# Patient Record
Sex: Male | Born: 1935 | Race: White | Hispanic: No | Marital: Single | Smoking: Former smoker
Health system: Southern US, Community
[De-identification: ages and names within clinical notes are randomized; demographics above are authoritative.]

## PROBLEM LIST (undated history)

## (undated) DIAGNOSIS — J449 Chronic obstructive pulmonary disease, unspecified: Secondary | ICD-10-CM

## (undated) DIAGNOSIS — N189 Chronic kidney disease, unspecified: Secondary | ICD-10-CM

## (undated) DIAGNOSIS — K222 Esophageal obstruction: Secondary | ICD-10-CM

---

## 2020-12-22 ENCOUNTER — Inpatient Hospital Stay (HOSPITAL_COMMUNITY)
Admission: EM | Admit: 2020-12-22 | Discharge: 2020-12-30 | DRG: 393 | Disposition: A | Discharge status: Hospice | Payer: Medicare Other | Source: Skilled Nursing Facility | Attending: Internal Medicine | Admitting: Internal Medicine

## 2020-12-22 ENCOUNTER — Encounter (HOSPITAL_COMMUNITY): Payer: Self-pay

## 2020-12-22 ENCOUNTER — Other Ambulatory Visit: Payer: Self-pay

## 2020-12-22 ENCOUNTER — Emergency Department (HOSPITAL_COMMUNITY): Payer: Medicare Other | Admitting: Registered Nurse

## 2020-12-22 ENCOUNTER — Encounter (HOSPITAL_COMMUNITY)
Admission: EM | Disposition: A | Discharge status: Hospice | Payer: Self-pay | Source: Skilled Nursing Facility | Attending: Internal Medicine

## 2020-12-22 ENCOUNTER — Observation Stay (HOSPITAL_COMMUNITY): Payer: Medicare Other

## 2020-12-22 ENCOUNTER — Emergency Department (HOSPITAL_COMMUNITY): Payer: Medicare Other

## 2020-12-22 DIAGNOSIS — G9341 Metabolic encephalopathy: Secondary | ICD-10-CM | POA: Diagnosis present

## 2020-12-22 DIAGNOSIS — N183 Chronic kidney disease, stage 3 unspecified: Secondary | ICD-10-CM

## 2020-12-22 DIAGNOSIS — Z8546 Personal history of malignant neoplasm of prostate: Secondary | ICD-10-CM

## 2020-12-22 DIAGNOSIS — J449 Chronic obstructive pulmonary disease, unspecified: Secondary | ICD-10-CM

## 2020-12-22 DIAGNOSIS — I2510 Atherosclerotic heart disease of native coronary artery without angina pectoris: Secondary | ICD-10-CM

## 2020-12-22 DIAGNOSIS — X58XXXA Exposure to other specified factors, initial encounter: Secondary | ICD-10-CM | POA: Diagnosis present

## 2020-12-22 DIAGNOSIS — I130 Hypertensive heart and chronic kidney disease with heart failure and stage 1 through stage 4 chronic kidney disease, or unspecified chronic kidney disease: Secondary | ICD-10-CM | POA: Diagnosis present

## 2020-12-22 DIAGNOSIS — D696 Thrombocytopenia, unspecified: Secondary | ICD-10-CM | POA: Diagnosis present

## 2020-12-22 DIAGNOSIS — F01518 Vascular dementia, unspecified severity, with other behavioral disturbance: Secondary | ICD-10-CM

## 2020-12-22 DIAGNOSIS — N179 Acute kidney failure, unspecified: Secondary | ICD-10-CM | POA: Diagnosis present

## 2020-12-22 DIAGNOSIS — T18128A Food in esophagus causing other injury, initial encounter: Principal | ICD-10-CM | POA: Diagnosis present

## 2020-12-22 DIAGNOSIS — Z888 Allergy status to other drugs, medicaments and biological substances status: Secondary | ICD-10-CM

## 2020-12-22 DIAGNOSIS — Z781 Physical restraint status: Secondary | ICD-10-CM

## 2020-12-22 DIAGNOSIS — Z66 Do not resuscitate: Secondary | ICD-10-CM | POA: Diagnosis present

## 2020-12-22 DIAGNOSIS — E782 Mixed hyperlipidemia: Secondary | ICD-10-CM

## 2020-12-22 DIAGNOSIS — F0151 Vascular dementia with behavioral disturbance: Secondary | ICD-10-CM | POA: Diagnosis present

## 2020-12-22 DIAGNOSIS — T17908A Unspecified foreign body in respiratory tract, part unspecified causing other injury, initial encounter: Secondary | ICD-10-CM

## 2020-12-22 DIAGNOSIS — N1832 Chronic kidney disease, stage 3b: Secondary | ICD-10-CM | POA: Diagnosis present

## 2020-12-22 DIAGNOSIS — E860 Dehydration: Secondary | ICD-10-CM | POA: Diagnosis present

## 2020-12-22 DIAGNOSIS — I509 Heart failure, unspecified: Secondary | ICD-10-CM

## 2020-12-22 DIAGNOSIS — Z515 Encounter for palliative care: Secondary | ICD-10-CM

## 2020-12-22 DIAGNOSIS — K222 Esophageal obstruction: Secondary | ICD-10-CM | POA: Diagnosis present

## 2020-12-22 DIAGNOSIS — Z885 Allergy status to narcotic agent status: Secondary | ICD-10-CM

## 2020-12-22 DIAGNOSIS — Z79899 Other long term (current) drug therapy: Secondary | ICD-10-CM

## 2020-12-22 DIAGNOSIS — J441 Chronic obstructive pulmonary disease with (acute) exacerbation: Secondary | ICD-10-CM | POA: Diagnosis present

## 2020-12-22 DIAGNOSIS — Z87891 Personal history of nicotine dependence: Secondary | ICD-10-CM

## 2020-12-22 DIAGNOSIS — Y92129 Unspecified place in nursing home as the place of occurrence of the external cause: Secondary | ICD-10-CM

## 2020-12-22 DIAGNOSIS — Z20822 Contact with and (suspected) exposure to covid-19: Secondary | ICD-10-CM | POA: Diagnosis present

## 2020-12-22 DIAGNOSIS — R0989 Other specified symptoms and signs involving the circulatory and respiratory systems: Secondary | ICD-10-CM

## 2020-12-22 DIAGNOSIS — J9601 Acute respiratory failure with hypoxia: Secondary | ICD-10-CM | POA: Diagnosis present

## 2020-12-22 DIAGNOSIS — H3530 Unspecified macular degeneration: Secondary | ICD-10-CM

## 2020-12-22 DIAGNOSIS — K56699 Other intestinal obstruction unspecified as to partial versus complete obstruction: Secondary | ICD-10-CM | POA: Diagnosis present

## 2020-12-22 DIAGNOSIS — J690 Pneumonitis due to inhalation of food and vomit: Secondary | ICD-10-CM | POA: Diagnosis present

## 2020-12-22 DIAGNOSIS — Z7189 Other specified counseling: Secondary | ICD-10-CM

## 2020-12-22 DIAGNOSIS — R0602 Shortness of breath: Secondary | ICD-10-CM

## 2020-12-22 DIAGNOSIS — K219 Gastro-esophageal reflux disease without esophagitis: Secondary | ICD-10-CM

## 2020-12-22 DIAGNOSIS — Z7982 Long term (current) use of aspirin: Secondary | ICD-10-CM

## 2020-12-22 HISTORY — DX: Gastro-esophageal reflux disease without esophagitis: K21.9

## 2020-12-22 HISTORY — DX: Unspecified macular degeneration: H35.30

## 2020-12-22 HISTORY — DX: Esophageal obstruction: K22.2

## 2020-12-22 HISTORY — DX: Chronic kidney disease, unspecified: N18.9

## 2020-12-22 HISTORY — DX: Mixed hyperlipidemia: E78.2

## 2020-12-22 HISTORY — PX: BALLOON DILATION: SHX5330

## 2020-12-22 HISTORY — DX: Atherosclerotic heart disease of native coronary artery without angina pectoris: I25.10

## 2020-12-22 HISTORY — PX: FOREIGN BODY REMOVAL: SHX962

## 2020-12-22 HISTORY — DX: Vascular dementia, unspecified severity, with other behavioral disturbance: F01.518

## 2020-12-22 HISTORY — DX: Vascular dementia with behavioral disturbance: F01.51

## 2020-12-22 HISTORY — DX: Chronic obstructive pulmonary disease, unspecified: J44.9

## 2020-12-22 HISTORY — PX: ESOPHAGOGASTRODUODENOSCOPY (EGD) WITH PROPOFOL: SHX5813

## 2020-12-22 HISTORY — DX: Heart failure, unspecified: I50.9

## 2020-12-22 HISTORY — DX: Personal history of malignant neoplasm of prostate: Z85.46

## 2020-12-22 LAB — POC SARS CORONAVIRUS 2 AG -  ED: SARSCOV2ONAVIRUS 2 AG: NEGATIVE

## 2020-12-22 SURGERY — ESOPHAGOGASTRODUODENOSCOPY (EGD) WITH PROPOFOL
Anesthesia: General

## 2020-12-22 MED ORDER — PHENYLEPHRINE 40 MCG/ML (10ML) SYRINGE FOR IV PUSH (FOR BLOOD PRESSURE SUPPORT)
PREFILLED_SYRINGE | INTRAVENOUS | Status: DC | PRN
  Administered 2020-12-22: 80 ug via INTRAVENOUS

## 2020-12-22 MED ORDER — ARFORMOTEROL TARTRATE 15 MCG/2ML IN NEBU
15.0000 ug | INHALATION_SOLUTION | Freq: Two times a day (BID) | RESPIRATORY_TRACT | Status: DC
  Administered 2020-12-23 – 2020-12-30 (×13): 15 ug via RESPIRATORY_TRACT
  Filled 2020-12-22 (×14): qty 2

## 2020-12-22 MED ORDER — IPRATROPIUM-ALBUTEROL 0.5-2.5 (3) MG/3ML IN SOLN
3.0000 mL | RESPIRATORY_TRACT | Status: DC | PRN
  Administered 2020-12-28: 3 mL via RESPIRATORY_TRACT
  Filled 2020-12-22: qty 3

## 2020-12-22 MED ORDER — SODIUM CHLORIDE 0.9 % IV SOLN
500.0000 mg | Freq: Every day | INTRAVENOUS | Status: DC
  Administered 2020-12-23 – 2020-12-27 (×6): 500 mg via INTRAVENOUS
  Filled 2020-12-22 (×7): qty 500

## 2020-12-22 MED ORDER — EPHEDRINE SULFATE-NACL 50-0.9 MG/10ML-% IV SOSY
PREFILLED_SYRINGE | INTRAVENOUS | Status: DC | PRN
  Administered 2020-12-22 (×3): 5 mg via INTRAVENOUS

## 2020-12-22 MED ORDER — PANTOPRAZOLE SODIUM 40 MG PO TBEC
40.0000 mg | DELAYED_RELEASE_TABLET | Freq: Every day | ORAL | Status: DC
  Administered 2020-12-27 – 2020-12-30 (×2): 40 mg via ORAL
  Filled 2020-12-22 (×4): qty 1

## 2020-12-22 MED ORDER — GLYCOPYRROLATE 0.2 MG/ML IJ SOLN
INTRAMUSCULAR | Status: DC | PRN
  Administered 2020-12-22 (×2): .1 mg via INTRAVENOUS

## 2020-12-22 MED ORDER — RISPERIDONE 0.25 MG PO TABS
0.2500 mg | ORAL_TABLET | Freq: Every day | ORAL | Status: DC
  Administered 2020-12-27: 0.25 mg via ORAL
  Filled 2020-12-22 (×7): qty 1

## 2020-12-22 MED ORDER — CEFTRIAXONE SODIUM 1 G IJ SOLR
2.0000 g | Freq: Every day | INTRAMUSCULAR | Status: DC
  Filled 2020-12-22: qty 20

## 2020-12-22 MED ORDER — POLYETHYLENE GLYCOL 3350 17 G PO PACK
17.0000 g | PACK | Freq: Every day | ORAL | Status: DC | PRN
  Filled 2020-12-22: qty 1

## 2020-12-22 MED ORDER — FUROSEMIDE 40 MG PO TABS: 40.0000 mg | ORAL_TABLET | Freq: Every day | ORAL | Status: DC

## 2020-12-22 MED ORDER — ONDANSETRON HCL 4 MG/2ML IJ SOLN
INTRAMUSCULAR | Status: DC | PRN
  Administered 2020-12-22: 4 mg via INTRAVENOUS

## 2020-12-22 MED ORDER — ACETAMINOPHEN 325 MG PO TABS
650.0000 mg | ORAL_TABLET | ORAL | Status: DC | PRN
  Filled 2020-12-22: qty 2

## 2020-12-22 MED ORDER — SODIUM CHLORIDE 0.9 % IV SOLN: INTRAVENOUS | Status: DC

## 2020-12-22 MED ORDER — LACTATED RINGERS IV SOLN: INTRAVENOUS | Status: DC | PRN

## 2020-12-22 MED ORDER — PROPOFOL 10 MG/ML IV BOLUS
INTRAVENOUS | Status: DC | PRN
  Administered 2020-12-22: 25 mg via INTRAVENOUS
  Administered 2020-12-22: 100 mg via INTRAVENOUS

## 2020-12-22 MED ORDER — DEXAMETHASONE SODIUM PHOSPHATE 10 MG/ML IJ SOLN
INTRAMUSCULAR | Status: DC | PRN
  Administered 2020-12-22: 5 mg via INTRAVENOUS

## 2020-12-22 MED ORDER — HALOPERIDOL LACTATE 5 MG/ML IJ SOLN: 2.5000 mg | Freq: Once | INTRAMUSCULAR | Status: DC

## 2020-12-22 MED ORDER — MELATONIN 5 MG PO TABS
10.0000 mg | ORAL_TABLET | Freq: Every evening | ORAL | Status: DC | PRN
  Filled 2020-12-22 (×2): qty 2

## 2020-12-22 MED ORDER — ROCURONIUM BROMIDE 10 MG/ML (PF) SYRINGE
PREFILLED_SYRINGE | INTRAVENOUS | Status: DC | PRN
  Administered 2020-12-22: 10 mg via INTRAVENOUS
  Administered 2020-12-22: 20 mg via INTRAVENOUS

## 2020-12-22 MED ORDER — LIDOCAINE 2% (20 MG/ML) 5 ML SYRINGE
INTRAMUSCULAR | Status: DC | PRN
  Administered 2020-12-22: 60 mg via INTRAVENOUS

## 2020-12-22 MED ORDER — ATORVASTATIN CALCIUM 40 MG PO TABS
40.0000 mg | ORAL_TABLET | Freq: Every day | ORAL | Status: DC
  Administered 2020-12-27: 40 mg via ORAL
  Filled 2020-12-22: qty 1

## 2020-12-22 MED ORDER — PANTOPRAZOLE SODIUM 40 MG IV SOLR
40.0000 mg | Freq: Once | INTRAVENOUS | Status: AC
  Administered 2020-12-23: 40 mg via INTRAVENOUS
  Filled 2020-12-22: qty 40

## 2020-12-22 MED ORDER — SUCCINYLCHOLINE CHLORIDE 200 MG/10ML IV SOSY
PREFILLED_SYRINGE | INTRAVENOUS | Status: DC | PRN
  Administered 2020-12-22: 100 mg via INTRAVENOUS

## 2020-12-22 MED ORDER — ONDANSETRON HCL 4 MG/2ML IJ SOLN: 4.0000 mg | Freq: Four times a day (QID) | INTRAMUSCULAR | Status: DC | PRN

## 2020-12-22 MED ORDER — SUGAMMADEX SODIUM 200 MG/2ML IV SOLN
INTRAVENOUS | Status: DC | PRN
  Administered 2020-12-22: 200 mg via INTRAVENOUS

## 2020-12-22 MED ORDER — ENOXAPARIN SODIUM 40 MG/0.4ML IJ SOSY: 40.0000 mg | PREFILLED_SYRINGE | INTRAMUSCULAR | Status: DC

## 2020-12-22 MED ORDER — ASPIRIN EC 81 MG PO TBEC: 81.0000 mg | DELAYED_RELEASE_TABLET | Freq: Every day | ORAL | Status: DC

## 2020-12-22 MED ORDER — PHENYLEPHRINE HCL-NACL 10-0.9 MG/250ML-% IV SOLN
INTRAVENOUS | Status: DC | PRN
  Administered 2020-12-22: 25 ug/min via INTRAVENOUS

## 2020-12-22 MED ORDER — LORAZEPAM 0.5 MG PO TABS: 0.5000 mg | ORAL_TABLET | Freq: Three times a day (TID) | ORAL | Status: DC | PRN

## 2020-12-22 MED ORDER — UMECLIDINIUM BROMIDE 62.5 MCG/INH IN AEPB
1.0000 | INHALATION_SPRAY | Freq: Every day | RESPIRATORY_TRACT | Status: DC
  Filled 2020-12-22: qty 7

## 2020-12-22 MED ORDER — TIOTROPIUM BROMIDE MONOHYDRATE 18 MCG IN CAPS: 18.0000 ug | ORAL_CAPSULE | Freq: Every day | RESPIRATORY_TRACT | Status: DC

## 2020-12-22 MED ORDER — IRBESARTAN 150 MG PO TABS: 150.0000 mg | ORAL_TABLET | Freq: Every day | ORAL | Status: DC

## 2020-12-22 MED ORDER — LORAZEPAM 2 MG/ML IJ SOLN
1.0000 mg | Freq: Once | INTRAMUSCULAR | Status: AC
  Administered 2020-12-22: 1 mg via INTRAVENOUS
  Filled 2020-12-22: qty 1

## 2020-12-22 MED ORDER — GLUCAGON HCL RDNA (DIAGNOSTIC) 1 MG IJ SOLR
1.0000 mg | Freq: Once | INTRAMUSCULAR | Status: AC
  Administered 2020-12-22: 1 mg via INTRAVENOUS
  Filled 2020-12-22: qty 1

## 2020-12-22 SURGICAL SUPPLY — 14 items

## 2020-12-22 NOTE — ED Provider Notes (Signed)
[facility] EMERGENCY DEPARTMENT Provider Note   CSN: [id number] Arrival date & time: 12/22/20  1659     History Chief Complaint  Patient presents with  . FOOD stuck in throat    Gabriel Austin is a 87 y.o. male.  HPI Patient is a 87 year old male with an extensive medical history presenting with a chief complaint of food impaction.  Patient does have a history of similar in the past.  Patient is not tolerating his secretions in triage.  Patient denies fevers or chills, nausea vomiting or severe shortness of breath.  Patient otherwise has been doing well recently moved into a skilled nursing facility.  Event occurred probably 6 hours prior to arrival.  No past medical history on file.  There are no problems to display for this patient.    No family history on file.     Home Medications Prior to Admission medications   Not on File    Allergies    Beta adrenergic blockers, Codeine, Reopro [abciximab], and Pulmicort [budesonide]  Review of Systems   Review of Systems  Constitutional: Negative for chills and fever.  HENT: Positive for trouble swallowing. Negative for ear pain and sore throat.   Eyes: Negative for pain and visual disturbance.  Respiratory: Negative for cough and shortness of breath.   Cardiovascular: Negative for chest pain and palpitations.  Gastrointestinal: Positive for vomiting. Negative for abdominal pain.  Genitourinary: Negative for dysuria and hematuria.  Musculoskeletal: Negative for arthralgias and back pain.  Skin: Negative for color change and rash.  Neurological: Negative for seizures and syncope.  All other systems reviewed and are negative.   Physical Exam Updated Vital Signs BP (!) 151/65 (BP Location: Left Arm)   Pulse (!) 55   Temp 98.2 F (36.8 C) (Oral)   Resp 12   SpO2 99%   Physical Exam Vitals and nursing note reviewed.  Constitutional:      Appearance: He is well-developed.  HENT:     Head:  Normocephalic and atraumatic.     Nose: No congestion or rhinorrhea.     Mouth/Throat:     Mouth: Mucous membranes are moist.     Pharynx: Oropharynx is clear. No oropharyngeal exudate.  Eyes:     Conjunctiva/sclera: Conjunctivae normal.     Pupils: Pupils are equal, round, and reactive to light.  Cardiovascular:     Rate and Rhythm: Normal rate and regular rhythm.     Heart sounds: No murmur heard.   Pulmonary:     Effort: Pulmonary effort is normal. No respiratory distress.     Breath sounds: Normal breath sounds.  Abdominal:     Palpations: Abdomen is soft.     Tenderness: There is no abdominal tenderness.  Musculoskeletal:        General: No swelling, tenderness, deformity or signs of injury. Normal range of motion.     Cervical back: Neck supple. No rigidity or tenderness.  Skin:    General: Skin is warm and dry.  Neurological:     General: No focal deficit present.     Mental Status: He is alert and oriented to person, place, and time. Mental status is at baseline.     Cranial Nerves: No cranial nerve deficit.     Motor: No weakness.     ED Results / Procedures / Treatments   Labs (all labs ordered are listed, but only abnormal results are displayed) Labs Reviewed - No data to display  EKG None  Radiology  No results found.  Procedures Procedures   Medications Ordered in ED Medications - No data to display  ED Course  I have reviewed the triage vital signs and the nursing notes.  Pertinent labs & imaging results that were available during my care of the patient were reviewed by me and considered in my medical decision making (see chart for details).    MDM Rules/Calculators/A&P                          Patient is a 87 year old male with an extensive medical history present with a chief complaint of drooling and inability to tolerate p.o. intake.  Patient had vomiting that initiated during his lunch today and has not been tolerating p.o. intake in the  interim.  Patient is a history of recurrent strictures and food impactions. Patient denies fevers or chills, nausea or vomit, syncope or shortness of breath.  Communicated with GI regarding patient's symptoms and agreed with likely food impaction.  Patient taken to GI endoscopy suite at this time.  Patient stable for transfer to endoscopy.  Final Clinical Impression(s) / ED Diagnoses Final diagnoses:  None    Rx / DC Orders ED Discharge Orders    None       Glyn Ade, MD 12/22/20 2225    Eber Hong, MD 12/30/20 1723

## 2020-12-22 NOTE — Op Note (Signed)
[facility] Patient Name: Gabriel Austin Procedure Date : 12/22/2020 MRN: [id number] Attending MD: Starr Lake. Myrtie Neither , MD Date of Birth: Jan 02, 1921 CSN: [id number] Age: 87 Admit Type: Emergency Department Procedure:                Upper GI endoscopy Indications:              Removal of foreign body in the esophagus (food                            impaction) - reported Hx of food impaction and                            endoscopic dilation at outside hospital Providers:                Sherilyn Cooter L. Myrtie Neither, MD, Rogue Jury, RN, Brooke                            Person, Rosilyn Mings, Technician Referring MD:             [facility] ED physician Medicines:                General Anesthesia Complications:            No immediate complications. Estimated Blood Loss:     Estimated blood loss was minimal. Procedure:                Pre-Anesthesia Assessment:                           - Prior to the procedure, a History and Physical                            was performed, and patient medications and                            allergies were reviewed. The patient's tolerance of                            previous anesthesia was also reviewed. The risks                            and benefits of the procedure and the sedation                            options and risks were discussed with the patient.                            All questions were answered, and informed consent                            was obtained. Prior Anticoagulants: The patient has                            taken no previous anticoagulant or antiplatelet  agents except for aspirin. ASA Grade Assessment:                            III - A patient with severe systemic disease. After                            reviewing the risks and benefits, the patient was                            deemed in satisfactory condition to undergo the                            procedure.                            After obtaining informed consent, the endoscope was                            passed under direct vision. Throughout the                            procedure, the patient's blood pressure, pulse, and                            oxygen saturations were monitored continuously. The                            GIF-H190 ([id number]) Olympus gastroscope was                            introduced through the mouth, and advanced to the                            second part of duodenum. The upper GI endoscopy was                            performed with difficulty due to presence of food.                            The patient tolerated the procedure well. Scope In: Scope Out: Findings:      Food was found in the entire esophagus. Removal of food (meat and       vegetable matter) was accomplished. Countless passes were made over the       course of the approximately 95 minute procedure. Most food was removed       with either a 4-pring grasper or a net device. The remainder was pushed       into the stomach.      One probable benign-appearing, intrinsic stenosis was found at the       gastroesophageal junction. It was difficult to be certain of the       stricture size with the mucosal edema from the prolonged impaction. The       stenosis was no more than 1-2cm in length and was traversed. A TTS  dilator was passed through the scope. Dilation with a 12-13.5-15 mm       balloon dilator was performed to 15 mm. A second 19mm dilation was then       performed. The dilation site was examined and showed no change and no       mucosal wrent. There was no resistance passing the scope through the       EGJ, nor was there dilatation to suggest achalasia.      The middle third of the esophagus and lower third of the esophagus were       tortuous.      Food (residue) was found in the gastric fundus and in the gastric body.      The exam of the stomach was otherwise normal.      The examined  duodenum was normal.      Food debris (vegetable matter) was found in the hypopharynx just above       vocal cords upon scope insertion. During the scope, anesthesia suctioned       vegetable matter from the ETT. Impression:               - Food in the esophagus. Removal was successful.                           - Benign-appearing esophageal stenosis. Dilated.                           - Tortuous esophagus.                           - Food (residue) in the stomach.                           - Normal examined duodenum.                           This patient's dysphagia is most likely due to                            tortuous anatomy and age-related dysmotility. Recommendation:           - Admit the patient to hospital ward for                            observation due to age, GETA and aspiration.                           - Clear liquid diet today. Ground diet starting in                            AM, and patient should remain on that indefinitely                            to decrease the risk of future food impaction.                           - The findings and recommendations were discussed  with the patient's family and the admitting                            hospitalist.                           - No routine office follow up with GI needed. Procedure Code(s):        --- Professional ---                           [id number], Esophagogastroduodenoscopy, flexible,                            transoral; with removal of foreign body(s)                           43249, Esophagogastroduodenoscopy, flexible,                            transoral; with transendoscopic balloon dilation of                            esophagus (less than 30 mm diameter) Diagnosis Code(s):        --- Professional ---                           [id number], Food in esophagus causing other injury,                            initial encounter                           K22.2, Esophageal  obstruction                           T18.2XXA, Foreign body in stomach, initial encounter                           T18.108A, Unspecified foreign body in esophagus                            causing other injury, initial encounter CPT copyright 2019 [employer]. All rights reserved. The codes documented in this report are preliminary and upon coder review may  be revised to meet current compliance requirements. Maninder Deboer L. Myrtie Neitheranis, MD 12/22/2020 10:02:02 PM This report has been signed electronically. Number of Addenda: 0

## 2020-12-22 NOTE — Transfer of Care (Signed)
Immediate Anesthesia Transfer of Care Note  Patient: Gabriel Austin  Procedure(s) Performed: ESOPHAGOGASTRODUODENOSCOPY (EGD) WITH PROPOFOL (N/A ) FOREIGN BODY REMOVAL BALLOON DILATION (N/A )  Patient Location: PACU  Anesthesia Type:General  Level of Consciousness: responds to stimulation; within baseline mentation  Airway & Oxygen Therapy: Patient Spontanous Breathing and Patient connected to face mask oxygen  Post-op Assessment: Report given to RN and Post -op Vital signs reviewed and stable  Post vital signs: Reviewed and stable  Last Vitals:  Vitals Value Taken Time  BP 132/114 12/22/20 2157  Temp    Pulse 71 12/22/20 2158  Resp 19 12/22/20 2158  SpO2 100 % 12/22/20 2158  Vitals shown include unvalidated device data.  Last Pain:  Vitals:   12/22/20 2004  TempSrc: Oral  PainSc: 0-No pain         Complications: No complications documented.

## 2020-12-22 NOTE — ED Triage Notes (Signed)
Pt daughter reports pt was eating chicken and broccoli today and food stuck in throat. Daughter states this happen before and tried using ice cream to get the food down and states pt cannot swallow saliva just hacking up saliva, Pt able to speak in triage and maintain airway O2 stats 99% RA

## 2020-12-22 NOTE — Interval H&P Note (Signed)
History and Physical Interval Note:  12/22/2020 8:12 PM  Gabriel Austin  has presented today for surgery, with the diagnosis of food impaction.  The various methods of treatment have been discussed with the patient and family. After consideration of risks, benefits and other options for treatment, the patient has consented to  Procedure(s): ESOPHAGOGASTRODUODENOSCOPY (EGD) WITH PROPOFOL (N/A) as a surgical intervention.  The patient's history has been reviewed, patient examined, no change in status, stable for surgery.  I have reviewed the patient's chart and labs.  Questions were answered to the patient's satisfaction.    Rapid COVID test negative  Charlie Pitter III

## 2020-12-22 NOTE — Anesthesia Procedure Notes (Signed)
Procedure Name: Intubation Date/Time: 12/22/2020 8:16 PM Performed by: Zollie Scale, CRNA Pre-anesthesia Checklist: Patient identified, Emergency Drugs available, Suction available and Patient being monitored Patient Re-evaluated:Patient Re-evaluated prior to induction Oxygen Delivery Method: Circle System Utilized Preoxygenation: Pre-oxygenation with 100% oxygen Induction Type: IV induction, Rapid sequence and Cricoid Pressure applied Laryngoscope Size: Miller and 2 Grade View: Grade I Tube type: Oral Tube size: 7.0 mm Number of attempts: 1 Airway Equipment and Method: Stylet Placement Confirmation: ETT inserted through vocal cords under direct vision,  positive ETCO2 and breath sounds checked- equal and bilateral Secured at: 23 cm Tube secured with: Tape Dental Injury: Teeth and Oropharynx as per pre-operative assessment  Comments: Gastric matter noted in posterior pharynx, vocal cords clean without matter upon direct view.

## 2020-12-22 NOTE — Progress Notes (Signed)
Patient admitted to [phone]. Very combative to staff. Kicking and punching staff. Staff is unable to take VS, obtan 12 lead EKG and place him on cardiac monitor. Patient also removed his nasal cannula. Paged on call Shalhoub,MD. Ativan 1mg  IV given per MD order. Fall precautions in place. Will continue to monitor patient.

## 2020-12-22 NOTE — Progress Notes (Signed)
MD completed consent at bedside, no consent order needed.

## 2020-12-22 NOTE — H&P (Addendum)
History and Physical    Gabriel Austin FAO:[id number] DOB: 30-Apr-1921 DOA: 12/22/2020  PCP: No primary care provider on file.  Patient coming from: [facility]   Chief Complaint:  Chief Complaint  Patient presents with  . FOOD stuck in throat     HPI:    87 year old male with past medical history of advanced vascular dementia, COPD, coronary artery disease, prostate cancer (S/P prostatectomy), chronic kidney disease stage III, gastroesophageal reflux disease, hypertension, congestive heart failure and frequent bouts of food impaction due to esophageal stricture with numerous esophageal dilatations presenting to [facility] emergency department with sudden onset of chest discomfort and tightness concerning for food impaction.  Patient is extremely lethargic at this time with an underlying history of advanced vascular dementia and therefore is a poor historian.  The majority the history is been obtained from the daughter who is at the bedside.  Patient's daughter explains that over the past 30 years the patient has had numerous episodes of food impaction, all secondary to esophageal stricture requiring esophageal dilatation.  She explains that earlier today, during lunch the patient was eating chicken and broccoli.  The patient suddenly complained of chest discomfort stating that he had another episode of food being stuck in his chest.  Shortly thereafter the patient also exhibited several episodes of scants amount of vomitus.  EMS was contacted and the patient was promptly brought into [facility] emergency department for evaluation.  Upon evaluation in the emergency department, patient was initially awake and able to speak to medical staff but progressively became more more somnolent throughout the emergency department course.  Dr. Myrtie Neither with the [facility] gastroenterology was called and promptly brought the patient for an EGD and attempt at disimpaction  of the esophagus.  EGD did confirm extensive amount of food in the entire esophagus.  Dr. Myrtie Neither was able to remove the food over the course of approximately 95 minutes.  An intrinsic stenosis was found at the GE junction with associated mucosal edema due to the prolonged impaction.  Patient did successfully undergo dilation.  Of note, during the procedure there was additionally food debris, likely broccoli noted to be in the hypopharynx just above the vocal cords and around the endotracheal tube after intubation bringing about concern for aspiration.  Hospital drip has been contacted by gastroenterology for hospitalization and monitoring overnight especially with suspected aspiration event.   Review of Systems:   Review of Systems  Unable to perform ROS: Mental acuity    Past Medical History:  Diagnosis Date  . Chronic congestive heart failure (HCC) 12/22/2020  . Chronic kidney disease   . COPD (chronic obstructive pulmonary disease) (HCC)   . Coronary artery disease involving native coronary artery of native heart without angina pectoris 12/22/2020  . Esophageal stricture   . GERD without esophagitis 12/22/2020  . History of prostate cancer 12/22/2020  . Macular degeneration 12/22/2020  . Mixed hyperlipidemia 12/22/2020  . Vascular dementia with behavioral disturbance (HCC) 12/22/2020    History reviewed. No pertinent surgical history.   reports that he has quit smoking. He has never used smokeless tobacco. No history on file for alcohol use and drug use.  Allergies  Allergen Reactions  . Beta Adrenergic Blockers Other (See Comments)    Lowers hr  . Codeine Nausea And Vomiting  . Reopro [Abciximab] Other (See Comments)    Lower platelet  . Pulmicort [Budesonide] Rash    History reviewed. No pertinent family history.   Prior  to Admission medications   Medication Sig Start Date End Date Taking? Authorizing Provider  arformoterol (BROVANA) 15 MCG/2ML NEBU Take 15 mcg by  nebulization 2 (two) times daily.   Yes [provider]  aspirin EC 81 MG tablet Take 81 mg by mouth daily. Swallow whole.   Yes [provider]  atorvastatin (LIPITOR) 40 MG tablet Take 40 mg by mouth daily.   Yes [provider]  irbesartan (AVAPRO) 150 MG tablet Take 150 mg by mouth daily.   Yes [provider]  LORazepam (ATIVAN) 0.5 MG tablet Take 0.5 mg by mouth at bedtime.   Yes [provider]  LORazepam (ATIVAN) 0.5 MG tablet Take 0.5 mg by mouth every 8 (eight) hours as needed for anxiety.   Yes [provider]  omeprazole (PRILOSEC) 40 MG capsule Take 40 mg by mouth daily.   Yes [provider]  risperiDONE (RISPERDAL) 0.25 MG tablet Take 0.25 mg by mouth daily at 2 PM.   Yes [provider]  tiotropium (SPIRIVA) 18 MCG inhalation capsule Place 18 mcg into inhaler and inhale daily.   Yes [provider]    Physical Exam: Vitals:   12/22/20 2205 12/22/20 2220 12/22/20 2225 12/22/20 2235  BP:  (!) 154/71  (!) 162/71  Pulse: 70     Resp: 16   (!) 21  Temp:   (!) 97.3 F (36.3 C) (!) 97.1 F (36.2 C)  TempSrc:      SpO2: 94%  92% 96%    Constitutional: Patient is extremely lethargic and minimally arousable and confused.  Patient is currently agitated.   Skin: no rashes, no lesions, good skin turgor noted. Eyes: Pupils are equally reactive to light.  No evidence of scleral icterus or conjunctival pallor.  ENMT: Moist mucous membranes noted.  Posterior pharynx clear of any exudate or lesions.   Neck: normal, supple, no masses, no thyromegaly.  No evidence of jugular venous distension.   Respiratory: Coarse breath sounds bilaterally with minimal expiratory wheezing, scattered rhonchi and bibasilar rales.  Normal respiratory effort. No accessory muscle use.  Cardiovascular: Regular rate and rhythm, no murmurs / rubs / gallops. No extremity edema. 2+ pedal pulses. No carotid bruits.  Chest:   Nontender  without crepitus or deformity.   Back:   Nontender without crepitus or deformity. Abdomen: Abdomen is protuberant but soft and nontender.  No evidence of intra-abdominal masses.  Positive bowel sounds noted in all quadrants.   Musculoskeletal: No joint deformity upper and lower extremities. Good ROM, no contractures. Normal muscle tone.  Neurologic: Patient is exhibiting substantial agitation throughout the exam.  Patient is disoriented.  Patient is moving all 4 extremities spontaneously.  Sensation is grossly intact.  Patient only intermittently follows commands.  Patient is responsive to verbal stimuli. Psychiatric: Unable to assess due to extreme agitation.  Patient currently does not seem to possess insight as to his current situation.     Labs on Admission: I have personally reviewed following labs and imaging studies -   CBC: No results for input(s): WBC, NEUTROABS, HGB, HCT, MCV, PLT in the last 168 hours. Basic Metabolic Panel: No results for input(s): NA, K, CL, CO2, GLUCOSE, BUN, CREATININE, CALCIUM, MG, PHOS in the last 168 hours. GFR: CrCl cannot be calculated (No successful lab value found.). Liver Function Tests: No results for input(s): AST, ALT, ALKPHOS, BILITOT, PROT, ALBUMIN in the last 168 hours. No results for input(s): LIPASE, AMYLASE in the last 168 hours. No results for  input(s): AMMONIA in the last 168 hours. Coagulation Profile: No results for input(s): INR, PROTIME in the last 168 hours. Cardiac Enzymes: No results for input(s): CKTOTAL, CKMB, CKMBINDEX, TROPONINI in the last 168 hours. BNP (last 3 results) No results for input(s): PROBNP in the last 8760 hours. HbA1C: No results for input(s): HGBA1C in the last 72 hours. CBG: No results for input(s): GLUCAP in the last 168 hours. Lipid Profile: No results for input(s): CHOL, HDL, LDLCALC, TRIG, CHOLHDL, LDLDIRECT in the last 72 hours. Thyroid Function Tests: No results for input(s): TSH, T4TOTAL, FREET4,  T3FREE, THYROIDAB in the last 72 hours. Anemia Panel: No results for input(s): VITAMINB12, FOLATE, FERRITIN, TIBC, IRON, RETICCTPCT in the last 72 hours. Urine analysis: No results found for: COLORURINE, APPEARANCEUR, LABSPEC, PHURINE, GLUCOSEU, HGBUR, BILIRUBINUR, KETONESUR, PROTEINUR, UROBILINOGEN, NITRITE, LEUKOCYTESUR  Radiological Exams on Admission - Personally Reviewed: DG Chest 1 View  Result Date: 12/22/2020 CLINICAL DATA:  Aspiration into airway. EXAM: CHEST  1 VIEW COMPARISON:  Radiograph earlier today. FINDINGS: Left costophrenic angle is not included in the field of view. Patchy left basilar airspace disease is similar to earlier today. There is new ill-defined right perihilar opacity. Peripheral reticulation at the right costophrenic angle appears chronic. Stable heart size and mediastinal contours. Aortic atherosclerosis. No pneumothorax. No evidence of pneumomediastinum. IMPRESSION: 1. New ill-defined right perihilar opacity, may be atelectasis or aspiration. 2. Unchanged patchy left basilar airspace disease from earlier today, may be atelectasis or pneumonia. 3. Left costophrenic angle excluded from the field of view. Patient had difficulty tolerating the exam. Electronically Signed   By: Narda RutherfordMelanie  Sanford M.D.   On: 12/22/2020 23:11   DG Chest Portable 1 View  Result Date: 12/22/2020 CLINICAL DATA:  Food impaction EXAM: PORTABLE CHEST 1 VIEW COMPARISON:  None. FINDINGS: The heart is enlarged. Vascular calcifications are seen in the aortic arch. There is mild left and minimal right basilar atelectasis/airspace disease. A small left pleural effusion may contribute. There is no right pleural effusion. There is no pneumothorax. Biapical pleuroparenchymal scarring is noted. Degenerative changes are seen in the spine. IMPRESSION: Mild left and minimal right basilar atelectasis/airspace disease. A small left pleural effusion may contribute. Electronically Signed   By: Romona Curlsyler  Litton M.D.   On:  12/22/2020 18:57    Telemetry: Personally reviewed.  Rhythm is normal sinus rhythm with heart rate of 80 bpm.  No dynamic ST segment changes appreciated.  Assessment/Plan Principal Problem:   Esophageal obstruction due to food impaction   Patient is status post EGD with removal of food impaction and dilation of associated esophageal stricture  Monitoring overnight per GI recommendations due to suspicion for aspiration due to food noted around the ET tube  Placing patient on clear liquid diet per GI recommendations.  Patient should be advanced to a ground consistency diet per GI recommendations.  We will additionally order SLP evaluation for the morning.    Active Problems:   Suspected pulmonary aspiration of food   Dr. Garner Nashaniels of gastroenterology reports food noted around the ET tube, likely broccoli  Concerning for aspiration, especially with patient exhibiting oxygen saturations of 89 to 90% in the PACU with coarse breath sounds on lung exam  I obtained a stat chest x-ray which reveals evidence of new right perihilar opacity concerning for new aspiration  No clinical evidence of actual infection secondary to aspiration, pneumonitis is most likely but considering patient's advanced age and extremely high risk of rapid decline will place patient on intravenous antibiotics which can  be quickly de-escalated as initial work-up comes back  Per IDSA guidelines, patient will be treated with ceftriaxone azithromycin.  No need for full anaerobic coverage in the absence of empyema or abscess.    CKD (chronic kidney disease), stage III (HCC)   Obtaining chemistry  Strict input and output monitoring  Avoiding nephrotoxic agents if at all possible    Vascular dementia with behavioral disturbance (HCC)   Patient exhibiting bouts of extreme agitation as he is arousing from sedation  Patient is typically on a regimen of scheduled risperidone and Ativan at his assisted living  facility.  We will try a trial dose of intravenous Ativan now.    We will additionally place order for sitter   Minimizing painful stimuli as much as possible.    We will attempt to obtain an EKG to evaluate QTc interval and additionally use as needed intravenous [name] if QTc interval is not prolonged.    Coronary artery disease involving native coronary artery of native heart without angina pectoris   Once patient is able to tolerate oral intake will resume home regimen of aspirin, atorvastatin    Mixed hyperlipidemia   Once patient is tolerating oral intake resume atorvastatin daily    GERD without esophagitis   Resume home regimen of daily PPI    COPD (chronic obstructive pulmonary disease) (HCC)   While patient is wheezing, I do believe patient is suffering from a true COPD exacerbation at this time  As needed bronchodilator therapy  Continuing home regimen of maintenance inhalers including Brovana and Spiriva    Chronic congestive heart failure (HCC)   Obtaining BNP  No clinical evidence of volume overload at this time  Resuming home regimen of Lasix 40 mg daily.   Code Status:  DNR Family Communication: Discussed plan of care with daughter at the bedside.  Status is: Observation  The patient remains OBS appropriate and will d/c before 2 midnights.  Dispo: The patient is from: ALF              Anticipated d/c is to: ALF              Patient currently is not medically stable to d/c.   Difficult to place patient Yes        Marinda Elk MD [facility] Pager [phone]  If 7PM-7AM, please contact night-coverage [email or web address] Use universal  password for that web site. If you do not have the password, please call the hospital operator.  12/22/2020, 11:19 PM

## 2020-12-22 NOTE — Anesthesia Preprocedure Evaluation (Addendum)
Anesthesia Evaluation  Patient identified by MRN, date of birth, ID band Patient awake    Reviewed: Allergy & Precautions, NPO status , Patient's Chart, lab work & pertinent test results  Airway Mallampati: II  TM Distance: >3 FB Neck ROM: Full    Dental  (+) Dental Advisory Given, Poor Dentition   Pulmonary COPD, former smoker,    breath sounds clear to auscultation       Cardiovascular hypertension, Pt. on medications + CAD and + Cardiac Stents   Rhythm:Regular Rate:Normal     Neuro/Psych negative neurological ROS     GI/Hepatic Neg liver ROS, +food impaction   Endo/Other  negative endocrine ROS  Renal/GU Renal disease     Musculoskeletal   Abdominal   Peds  Hematology negative hematology ROS (+)   Anesthesia Other Findings   Reproductive/Obstetrics                            Anesthesia Physical Anesthesia Plan  ASA: III and emergent  Anesthesia Plan: General   Post-op Pain Management:    Induction: Intravenous and Rapid sequence  PONV Risk Score and Plan: 2 and Dexamethasone, Ondansetron and Treatment may vary due to age or medical condition  Airway Management Planned: Oral ETT  Additional Equipment:   Intra-op Plan:   Post-operative Plan: Extubation in OR  Informed Consent: I have reviewed the patients History and Physical, chart, labs and discussed the procedure including the risks, benefits and alternatives for the proposed anesthesia with the patient or authorized representative who has indicated his/her understanding and acceptance.     Dental advisory given  Plan Discussed with: CRNA  Anesthesia Plan Comments:         Anesthesia Quick Evaluation

## 2020-12-22 NOTE — ED Provider Notes (Signed)
Emergency Medicine Provider Triage Evaluation Note  Gabriel Austin, a 87 y.o. male evaluated in triage.  Pt complains of food impaction.  Patient was eating lunch today, chicken, pasta, broccoli.  Has food impaction.  This is occurred many times in the past.  Had esophageal dilation about 2 years ago. Town, relocated from [place].  Is unable to swallow his saliva.  Is not sick on his stomach.  BP (!) 151/65 (BP Location: Left Arm)   Pulse (!) 55   Temp 98.2 F (36.8 C) (Oral)   Resp 12   SpO2 99%   Patient is alert, no acute distress, normal work of breathing.  Unable to swallow saliva, spitting into emesis bag  Alerted charge nurse patient in need of acute bed.    Medically screening exam initiated at 5:31 PM. Appropriate orders placed.  Gabriel Austin was informed that the remainder of the evaluation will be completed by another provider, this initial triage assessment does not replace that evaluation, and the importance of remaining in the ED until their evaluation is complete.       Gabriel Austin, [place] N, PA-C 12/22/20 1736    Gwyneth Sprout, MD 12/26/20 [id number]

## 2020-12-22 NOTE — Progress Notes (Signed)
I received a call about 6:30 PM from the ED physician on this elderly man who has a food impaction.  It apparently occurred around midday and he was eating chicken and broccoli.  He lives at an assisted living facility in [place], and his daughter Gabriel Austin is visiting him from [place].  She gives the history since the patient currently cannot provide any helpful history or review of systems as he is somnolent.  Gabriel Austin has lately started having sundowning in the evening and was to be transition to memory care according to his daughter.  Daughter says says he has had multiple food impactions in the past taken care of in [place], most recently about 2 years ago.  On the last occasion he had an endoscopic dilation performed.  He apparently had not been complaining of any dysphagia until this episode today.  He has a reported history of coronary atherosclerosis and is on aspirin, and history of congestive heart failure on diuretics.  He has not on any blood thinners other than aspirin.  The patient is somnolent when I enter the room, he opens his eyes briefly when his name is called.  He opens his mouth as well, revealing poor dentition.  He apparently vomited some secretions within the last hour indicating he still has a high-grade food impaction.  He is breathing comfortably on room air right now and his vital signs are normal.  Abdomen is soft nondistended nontender. No crepitus  Plan is for urgent upper endoscopy to relieve food impaction.  Endoscopic dilation might be performed if feasible. Upper endoscopy with dilation reviewed in detail with his daughter along with risks and benefits and she was agreeable.  She says she holds his financial and healthcare powers of attorney.   The benefits and risks of the planned procedure were described in detail with the patient or (when appropriate) their health care proxy.  Risks were outlined as including, but not limited to,  bleeding, infection, perforation, adverse medication reaction leading to cardiac or pulmonary decompensation, pancreatitis (if ERCP).  The limitation of incomplete mucosal visualization was also discussed.  No guarantees or warranties were given.  He will require general anesthesia for airway protection.  Hopefully he will tolerate this well and be ready for discharge later this evening.  If he has difficulty recovering from anesthesia or other unforeseen problems, he may need admission to the medical service.   Amada Jupiter, MD    [facility]

## 2020-12-22 NOTE — H&P (View-Only) (Signed)
I received a call about 6:30 PM from the ED physician on this elderly man who has a food impaction.  It apparently occurred around midday and he was eating chicken and broccoli.  He lives at an assisted living facility in , and his daughter Pamela Caldwell is visiting him from [place].  She gives the history since the patient currently cannot provide any helpful history or review of systems as he is somnolent.  Mr. Rubens has lately started having sundowning in the evening and was to be transition to memory care according to his daughter.  Daughter says says he has had multiple food impactions in the past taken care of in [place], most recently about 2 years ago.  On the last occasion he had an endoscopic dilation performed.  He apparently had not been complaining of any dysphagia until this episode today.  He has a reported history of coronary atherosclerosis and is on aspirin, and history of congestive heart failure on diuretics.  He has not on any blood thinners other than aspirin.  The patient is somnolent when I enter the room, he opens his eyes briefly when his name is called.  He opens his mouth as well, revealing poor dentition.  He apparently vomited some secretions within the last hour indicating he still has a high-grade food impaction.  He is breathing comfortably on room air right now and his vital signs are normal.  Abdomen is soft nondistended nontender. No crepitus  Plan is for urgent upper endoscopy to relieve food impaction.  Endoscopic dilation might be performed if feasible. Upper endoscopy with dilation reviewed in detail with his daughter along with risks and benefits and she was agreeable.  She says she holds his financial and healthcare powers of attorney.   The benefits and risks of the planned procedure were described in detail with the patient or (when appropriate) their health care proxy.  Risks were outlined as including, but not limited to,  bleeding, infection, perforation, adverse medication reaction leading to cardiac or pulmonary decompensation, pancreatitis (if ERCP).  The limitation of incomplete mucosal visualization was also discussed.  No guarantees or warranties were given.  He will require general anesthesia for airway protection.  Hopefully he will tolerate this well and be ready for discharge later this evening.  If he has difficulty recovering from anesthesia or other unforeseen problems, he may need admission to the medical service.   - Kimiye Strathman Danis, MD    [facility]  

## 2020-12-23 ENCOUNTER — Encounter (HOSPITAL_COMMUNITY): Payer: Self-pay | Admitting: Gastroenterology

## 2020-12-23 DIAGNOSIS — T18128A Food in esophagus causing other injury, initial encounter: Secondary | ICD-10-CM | POA: Diagnosis present

## 2020-12-23 DIAGNOSIS — Z20822 Contact with and (suspected) exposure to covid-19: Secondary | ICD-10-CM | POA: Diagnosis present

## 2020-12-23 DIAGNOSIS — I509 Heart failure, unspecified: Secondary | ICD-10-CM | POA: Diagnosis present

## 2020-12-23 DIAGNOSIS — K219 Gastro-esophageal reflux disease without esophagitis: Secondary | ICD-10-CM | POA: Diagnosis present

## 2020-12-23 DIAGNOSIS — Z66 Do not resuscitate: Secondary | ICD-10-CM | POA: Diagnosis present

## 2020-12-23 DIAGNOSIS — Z885 Allergy status to narcotic agent status: Secondary | ICD-10-CM | POA: Diagnosis not present

## 2020-12-23 DIAGNOSIS — J690 Pneumonitis due to inhalation of food and vomit: Secondary | ICD-10-CM | POA: Diagnosis present

## 2020-12-23 DIAGNOSIS — N179 Acute kidney failure, unspecified: Secondary | ICD-10-CM | POA: Diagnosis present

## 2020-12-23 DIAGNOSIS — G9341 Metabolic encephalopathy: Secondary | ICD-10-CM | POA: Diagnosis present

## 2020-12-23 DIAGNOSIS — Z515 Encounter for palliative care: Secondary | ICD-10-CM | POA: Diagnosis not present

## 2020-12-23 DIAGNOSIS — R0989 Other specified symptoms and signs involving the circulatory and respiratory systems: Secondary | ICD-10-CM | POA: Diagnosis not present

## 2020-12-23 DIAGNOSIS — E860 Dehydration: Secondary | ICD-10-CM | POA: Diagnosis present

## 2020-12-23 DIAGNOSIS — J9601 Acute respiratory failure with hypoxia: Secondary | ICD-10-CM | POA: Diagnosis present

## 2020-12-23 DIAGNOSIS — R0602 Shortness of breath: Secondary | ICD-10-CM | POA: Diagnosis not present

## 2020-12-23 DIAGNOSIS — J449 Chronic obstructive pulmonary disease, unspecified: Secondary | ICD-10-CM | POA: Diagnosis not present

## 2020-12-23 DIAGNOSIS — Z7982 Long term (current) use of aspirin: Secondary | ICD-10-CM | POA: Diagnosis not present

## 2020-12-23 DIAGNOSIS — E782 Mixed hyperlipidemia: Secondary | ICD-10-CM | POA: Diagnosis present

## 2020-12-23 DIAGNOSIS — T17908A Unspecified foreign body in respiratory tract, part unspecified causing other injury, initial encounter: Secondary | ICD-10-CM | POA: Diagnosis not present

## 2020-12-23 DIAGNOSIS — N183 Chronic kidney disease, stage 3 unspecified: Secondary | ICD-10-CM | POA: Diagnosis not present

## 2020-12-23 DIAGNOSIS — Z79899 Other long term (current) drug therapy: Secondary | ICD-10-CM | POA: Diagnosis not present

## 2020-12-23 DIAGNOSIS — J441 Chronic obstructive pulmonary disease with (acute) exacerbation: Secondary | ICD-10-CM | POA: Diagnosis present

## 2020-12-23 DIAGNOSIS — Z87891 Personal history of nicotine dependence: Secondary | ICD-10-CM | POA: Diagnosis not present

## 2020-12-23 DIAGNOSIS — D696 Thrombocytopenia, unspecified: Secondary | ICD-10-CM | POA: Diagnosis present

## 2020-12-23 DIAGNOSIS — I2510 Atherosclerotic heart disease of native coronary artery without angina pectoris: Secondary | ICD-10-CM | POA: Diagnosis present

## 2020-12-23 DIAGNOSIS — Y92129 Unspecified place in nursing home as the place of occurrence of the external cause: Secondary | ICD-10-CM | POA: Diagnosis not present

## 2020-12-23 DIAGNOSIS — Z888 Allergy status to other drugs, medicaments and biological substances status: Secondary | ICD-10-CM | POA: Diagnosis not present

## 2020-12-23 DIAGNOSIS — X58XXXA Exposure to other specified factors, initial encounter: Secondary | ICD-10-CM | POA: Diagnosis present

## 2020-12-23 DIAGNOSIS — N1832 Chronic kidney disease, stage 3b: Secondary | ICD-10-CM | POA: Diagnosis present

## 2020-12-23 DIAGNOSIS — K56699 Other intestinal obstruction unspecified as to partial versus complete obstruction: Secondary | ICD-10-CM | POA: Diagnosis present

## 2020-12-23 DIAGNOSIS — F0151 Vascular dementia with behavioral disturbance: Secondary | ICD-10-CM | POA: Diagnosis present

## 2020-12-23 DIAGNOSIS — K222 Esophageal obstruction: Secondary | ICD-10-CM | POA: Diagnosis present

## 2020-12-23 DIAGNOSIS — I130 Hypertensive heart and chronic kidney disease with heart failure and stage 1 through stage 4 chronic kidney disease, or unspecified chronic kidney disease: Secondary | ICD-10-CM | POA: Diagnosis present

## 2020-12-23 DIAGNOSIS — Z7189 Other specified counseling: Secondary | ICD-10-CM | POA: Diagnosis not present

## 2020-12-23 LAB — CBC WITH DIFFERENTIAL/PLATELET
Abs Immature Granulocytes: 0.03 10*3/uL (ref 0.00–0.07)
Basophils Absolute: 0 10*3/uL (ref 0.0–0.1)
Basophils Relative: 0 %
Eosinophils Absolute: 0 10*3/uL (ref 0.0–0.5)
Eosinophils Relative: 0 %
HCT: 33.7 % — ABNORMAL LOW (ref 39.0–52.0)
Hemoglobin: 10.9 g/dL — ABNORMAL LOW (ref 13.0–17.0)
Immature Granulocytes: 0 %
Lymphocytes Relative: 5 %
Lymphs Abs: 0.5 10*3/uL — ABNORMAL LOW (ref 0.7–4.0)
MCH: 30.6 pg (ref 26.0–34.0)
MCHC: 32.3 g/dL (ref 30.0–36.0)
MCV: 94.7 fL (ref 80.0–100.0)
Monocytes Absolute: 0.4 10*3/uL (ref 0.1–1.0)
Monocytes Relative: 5 %
Neutro Abs: 8.4 10*3/uL — ABNORMAL HIGH (ref 1.7–7.7)
Neutrophils Relative %: 90 %
Platelets: 122 10*3/uL — ABNORMAL LOW (ref 150–400)
RBC: 3.56 MIL/uL — ABNORMAL LOW (ref 4.22–5.81)
RDW: 13.5 % (ref 11.5–15.5)
WBC: 9.3 10*3/uL (ref 4.0–10.5)
nRBC: 0 % (ref 0.0–0.2)

## 2020-12-23 LAB — BRAIN NATRIURETIC PEPTIDE: B Natriuretic Peptide: 146.7 pg/mL — ABNORMAL HIGH (ref 0.0–100.0)

## 2020-12-23 LAB — COMPREHENSIVE METABOLIC PANEL
ALT: 26 U/L (ref 0–44)
AST: 19 U/L (ref 15–41)
Albumin: 3.5 g/dL (ref 3.5–5.0)
Alkaline Phosphatase: 113 U/L (ref 38–126)
Anion gap: 11 (ref 5–15)
BUN: 38 mg/dL — ABNORMAL HIGH (ref 8–23)
CO2: 22 mmol/L (ref 22–32)
Calcium: 8.9 mg/dL (ref 8.9–10.3)
Chloride: 110 mmol/L (ref 98–111)
Creatinine, Ser: 1.85 mg/dL — ABNORMAL HIGH (ref 0.61–1.24)
GFR, Estimated: 33 mL/min — ABNORMAL LOW (ref >60)
Glucose, Bld: 137 mg/dL — ABNORMAL HIGH (ref 70–99)
Potassium: 4 mmol/L (ref 3.5–5.1)
Sodium: 143 mmol/L (ref 135–145)
Total Bilirubin: 0.9 mg/dL (ref 0.3–1.2)
Total Protein: 6.3 g/dL — ABNORMAL LOW (ref 6.5–8.1)

## 2020-12-23 LAB — MAGNESIUM: Magnesium: 2 mg/dL (ref 1.7–2.4)

## 2020-12-23 LAB — PROCALCITONIN: Procalcitonin: 0.13 ng/mL

## 2020-12-23 LAB — C-REACTIVE PROTEIN: CRP: 0.6 mg/dL (ref <1.0)

## 2020-12-23 LAB — HIV ANTIBODY (ROUTINE TESTING W REFLEX): HIV Screen 4th Generation wRfx: NONREACTIVE

## 2020-12-23 MED ORDER — DIPHENHYDRAMINE HCL 50 MG/ML IJ SOLN
25.0000 mg | Freq: Three times a day (TID) | INTRAMUSCULAR | Status: DC | PRN
  Administered 2020-12-23 – 2020-12-25 (×4): 25 mg via INTRAVENOUS
  Filled 2020-12-23 (×4): qty 1

## 2020-12-23 MED ORDER — HALOPERIDOL LACTATE 5 MG/ML IJ SOLN
5.0000 mg | Freq: Three times a day (TID) | INTRAMUSCULAR | Status: DC | PRN
  Administered 2020-12-24: 5 mg via INTRAVENOUS
  Filled 2020-12-23 (×3): qty 1

## 2020-12-23 MED ORDER — LORAZEPAM 2 MG/ML IJ SOLN
1.0000 mg | Freq: Once | INTRAMUSCULAR | Status: AC
  Administered 2020-12-23: 1 mg via INTRAVENOUS
  Filled 2020-12-23: qty 1

## 2020-12-23 MED ORDER — HALOPERIDOL LACTATE 5 MG/ML IJ SOLN
2.5000 mg | Freq: Once | INTRAMUSCULAR | Status: AC
  Administered 2020-12-23: 2.5 mg via INTRAVENOUS
  Filled 2020-12-23: qty 1

## 2020-12-23 MED ORDER — ENOXAPARIN SODIUM 30 MG/0.3ML IJ SOSY
30.0000 mg | PREFILLED_SYRINGE | INTRAMUSCULAR | Status: DC
  Administered 2020-12-24 – 2020-12-25 (×2): 30 mg via SUBCUTANEOUS
  Filled 2020-12-23 (×2): qty 0.3

## 2020-12-23 MED ORDER — SODIUM CHLORIDE 0.9 % IV SOLN
2.0000 g | INTRAVENOUS | Status: DC
  Administered 2020-12-23 – 2020-12-27 (×5): 2 g via INTRAVENOUS
  Filled 2020-12-23 (×2): qty 20
  Filled 2020-12-23 (×4): qty 2

## 2020-12-23 MED ORDER — LORAZEPAM 2 MG/ML IJ SOLN
1.0000 mg | Freq: Four times a day (QID) | INTRAMUSCULAR | Status: DC | PRN
  Administered 2020-12-23 – 2020-12-24 (×3): 1 mg via INTRAVENOUS
  Filled 2020-12-23 (×3): qty 1

## 2020-12-23 MED ORDER — SODIUM CHLORIDE 0.9 % IV SOLN
2.0000 g | Freq: Once | INTRAVENOUS | Status: AC
  Administered 2020-12-23: 2 g via INTRAVENOUS
  Filled 2020-12-23: qty 2

## 2020-12-23 MED ORDER — ACETAMINOPHEN 650 MG RE SUPP
650.0000 mg | Freq: Three times a day (TID) | RECTAL | Status: DC | PRN
  Administered 2020-12-23: 650 mg via RECTAL
  Filled 2020-12-23: qty 1

## 2020-12-23 NOTE — Progress Notes (Signed)
Wrist restraints released from bed at 0850. At 1210, pt was arousing a little and every now and then tugging at the O2 sensor on his finger. Placed mittens on pt's hands. Pt is resting comfortably.

## 2020-12-23 NOTE — Progress Notes (Addendum)
Pt had blood drawn 04/29 for the TB test per [facility].  Pt got very agitated when we tried to move him to clean him up after the primo came off. Administered PRN ativan.  Administered Tylenol suppository at 1852 for T of 100.8. (Notified MD to get rectal order for Tylenol in chart.)  Family is paying for a private safety sitter for the weekend since we cannot guarantee a safety sitter each shift. They would prefer to have a human bedside. They do not believe he would respond to a telesitter.  Family would prefer he not be in restraints. From 0830 until 1900, pt wrist restraints were released from the bed. Pt had on safety mittens to prevent him from pulling at lines and IV site. Belt restraint remained.

## 2020-12-23 NOTE — Anesthesia Postprocedure Evaluation (Signed)
Anesthesia Post Note  Patient: Gabriel Austin  Procedure(s) Performed: ESOPHAGOGASTRODUODENOSCOPY (EGD) WITH PROPOFOL (N/A ) FOREIGN BODY REMOVAL BALLOON DILATION (N/A )     Patient location during evaluation: PACU Anesthesia Type: General Level of consciousness: awake and alert Pain management: pain level controlled Vital Signs Assessment: post-procedure vital signs reviewed and stable Respiratory status: spontaneous breathing, nonlabored ventilation, respiratory function stable and patient connected to nasal cannula oxygen Cardiovascular status: blood pressure returned to baseline and stable Postop Assessment: no apparent nausea or vomiting Anesthetic complications: no   No complications documented.  Last Vitals:  Vitals:   12/22/20 2225 12/22/20 2235  BP:  (!) 162/71  Pulse:    Resp:  (!) 21  Temp: (!) 36.3 C (!) 36.2 C  SpO2: 92% 96%    Last Pain:  Vitals:   12/22/20 2235  TempSrc:   PainSc: 0-No pain                 Kennieth Rad

## 2020-12-23 NOTE — Progress Notes (Addendum)
Patient was given another dose of Ativan(not effective). Patient still swinging his arms/kicking staff. Trying to get out of the bed multiple times. Staff is unable to reorient patient. Shalhoub, MD on call was made aware. New order to put patient on non-violent restraints (soft wrist & soft waist belt) . POA daughter [name] was made aware, appreciative about the update. Cardiac Monitor placed on patient. Patient is currently resting in bed with bed alarm on. Will continue to monitor patient.

## 2020-12-23 NOTE — Progress Notes (Signed)
Attempted to get patient's vitals signs. BP 135/54 PR 78 T97.7Ax RR19. Attempted to put patient back to nasal cannula yet patient still takes it out. HOB slightly elevated. Still unable to put patient on cardiac monitor. Shalhoub MD made aware

## 2020-12-23 NOTE — Progress Notes (Signed)
PROGRESS NOTE  Gabriel Austin  DOB: [birth year]  PCP: No primary care provider on file. GYI:[id number]  DOA: 12/22/2020  LOS: 0 days   Chief Complaint  Patient presents with  . FOOD stuck in throat    Brief narrative: Gabriel Austin is a 87 y.o. male with PMH significant for advanced vascular dementia, HTN, CHF, CAD, CKD 3, COPD, GERD, prostate cancer (s/p prostatectomy) and frequent bouts of food impaction due to esophageal stricture with numerous esophageal dilatations in the past.  Patient was brought to the ED on 4/28 with sudden onset of chest discomfort and tightening while taking his lunch concerning for food impaction again  In the ED.  Patient was initially awake and able to speak but progressively become more somnolent. Seen emergently by GI Dr. Myrtie Neither and underwent and EGD with an attempt of disimpaction. EGD confirmed extensive amount of food in the esophagus which was removed.   Patient was admitted to hospital service for suspected aspiration event. Overnight, patient's mental status was erratic, he was restless agitated and required chemical and physical restraints.  Subjective: Patient was seen and examined this morning. Elderly Caucasian male.  Sedated after dose of [name] earlier.  On bilateral wrist restraints. Events from last night noted.  Mental status was difficult to control requiring restraints. Spoke with daughter and son-in-law at bedside this morning. Chart reviewed No fever, blood pressure elevated to 160s, on 3 L oxygen by nasal cannula. Labs with creatinine elevated to 1.85  Assessment/Plan: Food impaction due to esophageal stricture History of recurrent food impaction. -Underwent EGD with removal of food impaction and dilation of esophageal stricture. -Currently on clear liquid diet.  Suspected pulmonary aspiration of food Acute respiratory failure with hypoxia -Patient's oxygen saturation was down to 89 to 90% in the PACU with coarse breath sounds  on exam. -Chest x-ray showed evidence of new right perihilar opacity concerning for new aspiration. -Patient was started on IV Rocephin and azithromycin. -Once his oral intake is ensured, we can switch him to oral antibiotics. Recent Labs  Lab 12/23/20 0309  WBC 9.3  PROCALCITON 0.13   Acute metabolic encephalopathy Vascular dementia with behavioral symptoms -Significantly altered mental status overnight in the hospital. -On physical and chemical restraints. -Per daughter.  His stay at the assisted living facility has been lately difficult because of behavioral symptoms.  He has a Designer, jewellery.  Patient is tentatively planned for transfer to memory care unit at a different facility on Monday, 12/26/2020.   Cardiovascular issues HTN, HLD, CHF, CAD, CKD 3b -Home meds include aspirin 81 mg daily, Lipitor 40 mg daily, irbesartan 150 mg daily -Continue same.  Does not seem to be on Lasix at home. -No angina symptoms.  Blood pressure stable.  No CHF symptoms.  Baseline creatinine not available. Recent Labs    12/23/20 0309  BUN 38*  CREATININE 1.85*   GERD without esophagitis -PPI  COPD -On bronchodilators  Mobility: Can be encouraged once mental status is intact Code Status:   Code Status: DNR  Nutritional status: There is no height or weight on file to calculate BMI.     Diet Order            Diet clear liquid Room service appropriate? Yes; Fluid consistency: Thin  Diet effective now                 DVT prophylaxis:   Antimicrobials:  IV Rocephin/azithromycin Fluid: We will start on gentle IV hydration if mental status and oral intake  remains poor Consultants: GI Family Communication:  Spoke with daughter and son-in-law at bedside  Status is: Inpatient  Remains inpatient appropriate because: Continues to be altered  Dispo: The patient is from: Assisted living facility              Anticipated d/c is to: Back to assisted living facility/memory care               Patient currently is not medically stable to d/c.   Difficult to place patient No     Infusions:  . azithromycin 500 mg (12/23/20 0203)  . cefTRIAXone (ROCEPHIN)  IV      Scheduled Meds: . arformoterol  15 mcg Nebulization BID  . aspirin EC  81 mg Oral Daily  . atorvastatin  40 mg Oral Daily  . enoxaparin (LOVENOX) injection  40 mg Subcutaneous Q24H  . irbesartan  150 mg Oral Daily  . pantoprazole  40 mg Oral Daily  . risperiDONE  0.25 mg Oral Q1400  . umeclidinium bromide  1 puff Inhalation Daily    Antimicrobials: Anti-infectives (From admission, onward)   Start     Dose/Rate Route Frequency Ordered Stop   12/23/20 2200  cefTRIAXone (ROCEPHIN) 2 g in sodium chloride 0.9 % 100 mL IVPB        2 g 200 mL/hr over 30 Minutes Intravenous Every 24 hours 12/23/20 0053     12/23/20 0145  cefTRIAXone (ROCEPHIN) 2 g in sodium chloride 0.9 % 100 mL IVPB        2 g 200 mL/hr over 30 Minutes Intravenous  Once 12/23/20 0053 12/23/20 0219   12/23/20 0000  cefTRIAXone (ROCEPHIN) injection 2 g  Status:  Discontinued        2 g Intramuscular Daily at bedtime 12/22/20 2351 12/23/20 0052   12/23/20 0000  azithromycin (ZITHROMAX) 500 mg in sodium chloride 0.9 % 250 mL IVPB        500 mg 250 mL/hr over 60 Minutes Intravenous Daily at bedtime 12/22/20 2351        PRN meds: acetaminophen, haloperidol lactate, ipratropium-albuterol, melatonin, ondansetron (ZOFRAN) IV, polyethylene glycol   Objective: Vitals:   12/23/20 0045 12/23/20 0748  BP: (!) 135/54   Pulse: 78 73  Resp: 19 18  Temp: 97.7 F (36.5 C)   SpO2:  96%    Intake/Output Summary (Last 24 hours) at 12/23/2020 1142 Last data filed at 12/23/2020 0100 Gross per 24 hour  Intake 500 ml  Output --  Net 500 ml   There were no vitals filed for this visit. Weight change:  There is no height or weight on file to calculate BMI.   Physical Exam: General exam: Pleasant, elderly Caucasian male.  Not in physical distress.  Remains  sedated this morning Skin: No rashes, lesions or ulcers. HEENT: Atraumatic, normocephalic, no obvious bleeding Lungs: Clear to auscultation bilaterally CVS: Regular rate and rhythm, no murmur GI/Abd soft, nontender, nondistended, bowel sound present CNS: Sedated at the time of my evaluation, not restless or agitated.  Has bilateral wrist restraints in place Psychiatry: Unable to examine because of altered mental status Extremities: No pedal edema, no calf redness  Data Review: I have personally reviewed the laboratory data and studies available.  Recent Labs  Lab 12/23/20 0309  WBC 9.3  NEUTROABS 8.4*  HGB 10.9*  HCT 33.7*  MCV 94.7  PLT 122*   Recent Labs  Lab 12/23/20 0309  NA 143  K 4.0  CL 110  CO2 22  GLUCOSE 137*  BUN 38*  CREATININE 1.85*  CALCIUM 8.9  MG 2.0    F/u labs ordered Unresulted Labs (From admission, onward)          Start     Ordered   12/23/20 0500  CBC WITH DIFFERENTIAL  Tomorrow morning,   R        12/22/20 2318          Signed, Lorin Glass, MD [facility] 12/23/2020

## 2020-12-23 NOTE — Progress Notes (Signed)
Pacu Progress Note  Pt is very confused with 4/5 Dementia. He is combative with any movement or interventions done to pt and even when you let him know what you are going to do. Kicking, hitting, swinging, digging nails into RN arm.   Called staffing to see if pt could have a sitter overnight since his daughter could not stay with pt. Staffing does not have one at this time but will call Jewel RN if one becomes available.   Took pt up to his room, staff will watch pt and is aware a sitter is not available at this time. Bed in low position, near RN station.   Pt exits my care.

## 2020-12-23 NOTE — TOC Initial Note (Signed)
Transition of Care [facility]) - Initial/Assessment Note    Patient Details  Name: Gabriel Austin MRN: [id number] Date of Birth: [birth year]  Transition of Care [facility]) CM/SW Contact:    Jimmy Picket, [place] Phone Number: 12/23/2020, 4:20 PM  Clinical Narrative:                  CSW spoke to pts daughter Elita Quick and husband Renae Fickle. Pam states that pt is grom [facility] and has plans to go to [facility] care unit on Monday. CSW was unable to confirm with [facility] place.   Expected Discharge Plan: Memory Care Barriers to Discharge: No Barriers Identified   Patient Goals and CMS Choice Patient states their goals for this hospitalization and ongoing recovery are:: go to memory care on monday CMS Medicare.gov Compare Post Acute Care list provided to:: Patient Choice offered to / list presented to : Adult Children  Expected Discharge Plan and Services Expected Discharge Plan: Memory Care       Living arrangements for the past 2 months: Assisted Living Facility                                      Prior Living Arrangements/Services Living arrangements for the past 2 months: Assisted Living Facility Lives with:: Facility Resident Patient language and need for interpreter reviewed:: Yes Do you feel safe going back to the place where you live?: Yes      Need for Family Participation in Patient Care: Yes (Comment) Care giver support system in place?: Yes (comment)   Criminal Activity/Legal Involvement Pertinent to Current Situation/Hospitalization: No - Comment as needed  Activities of Daily Living      Permission Sought/Granted Permission sought to share information with : Facility Industrial/product designer granted to share information with : Yes, Verbal Permission Granted     Permission granted to share info w AGENCY: ALF        Emotional Assessment Appearance:: Appears stated age Attitude/Demeanor/Rapport: Unable to Assess Affect (typically  observed): Unable to Assess Orientation: : Oriented to Self Alcohol / Substance Use: Not Applicable Psych Involvement: No (comment)  Admission diagnosis:  Food bolus obstruction of intestine [facility]) [K56.699] Patient Active Problem List   Diagnosis Date Noted  . Food bolus obstruction of intestine (HCC) 12/23/2020  . Esophageal obstruction due to food impaction 12/22/2020  . CKD (chronic kidney disease), stage III (HCC) 12/22/2020  . Vascular dementia with behavioral disturbance (HCC) 12/22/2020  . Suspected pulmonary aspiration of food 12/22/2020  . Coronary artery disease involving native coronary artery of native heart without angina pectoris 12/22/2020  . Mixed hyperlipidemia 12/22/2020  . GERD without esophagitis 12/22/2020  . COPD (chronic obstructive pulmonary disease) (HCC) 12/22/2020  . History of prostate cancer 12/22/2020  . Macular degeneration 12/22/2020  . Chronic congestive heart failure (HCC) 12/22/2020   PCP:  No primary care provider on file. Pharmacy:  No Pharmacies Listed    Social Determinants of Health (SDOH) Interventions    Readmission Risk Interventions No flowsheet data found.  Jimmy Picket, Theresia Majors, [place] Clinical Social Worker [phone]

## 2020-12-24 DIAGNOSIS — K222 Esophageal obstruction: Secondary | ICD-10-CM | POA: Diagnosis not present

## 2020-12-24 DIAGNOSIS — T18128A Food in esophagus causing other injury, initial encounter: Secondary | ICD-10-CM | POA: Diagnosis not present

## 2020-12-24 MED ORDER — DEXTROSE-NACL 5-0.2 % IV SOLN
INTRAVENOUS | Status: DC
  Administered 2020-12-25 – 2020-12-27 (×2): 1000 mL via INTRAVENOUS

## 2020-12-24 NOTE — Progress Notes (Signed)
PROGRESS NOTE    [name]  RCV:[id number] DOB: Apr 12, 1921 DOA: 12/22/2020 PCP: No primary care provider on file.   Brief Narrative:  Gabriel Austin is a 87 y.o. male with PMH significant for advanced vascular dementia, HTN, CHF, CAD, CKD 3, COPD, GERD, prostate cancer(s/p prostatectomy) and frequent bouts of food impaction due to esophageal stricture with numerous esophageal dilatations in the past.  Patient was brought to the ED on 4/28 with sudden onset of chest discomfort and tightening while taking his lunch concerning for food impaction again  In the ED.  Patient was initially awake and able to speak but progressively become more somnolent. Seen emergently by GI Dr. Myrtie Neither and underwent and EGD with an attempt of disimpaction. EGD confirmed extensive amount of food in the esophagus which was removed.   Patient was admitted to hospital service for suspected aspiration event. Overnight, patient's mental status was erratic, he was restless agitated and required chemical and physical restraints.  Assessment & Plan:  Food impaction due to esophageal stricture: -Underwent EGD with removal of food impaction and dilation of esophageal stricture -Appreciate GIs help -Evaluated by SLP-recommend to keep him n.p.o. -He is not good candidate for tube feedings due to his underlying dementia. -Start on gentle hydration.  Suspected aspiration pneumonia: Acute hypoxemic respiratory failure: -Patient's oxygen saturation dropped to 89 to 90% in PACU with coarse breath sounds on exam -Chest x-ray concerning for aspiration.  Patient started on IV Rocephin and azithromycin. -He is evaluated by SLP recommended n.p.o. -He is currently on room air  Acute metabolic encephalopathy Vascular dementia with behavioral symptoms: -In restraints -He has private 24-hour sitter.  Family is planning for transfer to memory care unit if remains stable on 12/26/2020. -Continue Ativan and [name] as  needed  Hypertension Hyperlipidemia Chronic CHF Coronary artery disease and CKD stage IIIb: At baseline -On aspirin, statin, irbesartan.  Not on Lasix.  He appears euvolemic on exam.  No signs of fluid overload. -His p.o. home meds are held due to n.p.o. status.  Monitor vitals closely and signs for fluid overload  GERD without esophagitis: Hold PPI  COPD: On bronchodilators as needed  I had long conversation with patient's daughter Elita Quick and discussed goals of care.  Overall patient has poor prognosis.  Family is inclined toward comfort measures.  We will consult palliative care.  DVT prophylaxis: Lovenox Code Status: DNR Family Communication:  None present at bedside.  Plan of care discussed with patient in length and he verbalized understanding and agreed with it.  Call patient's daughter and discussed plan of care and she verbalized understanding.  Disposition Plan: To be determined  Consultants:   GI  Procedures:   EGD  Antimicrobials:   Azithromycin  Rocephin  Status is: Inpatient   Dispo: The patient is from: ALF              Anticipated d/c is to: ALF/memory care               Patient currently is not medically stable to d/c.   Difficult to place patient No     Subjective: Patient seen and examined.  Sitter at the bedside.  Patient appears confused, in restraints, remained afebrile.   Objective: Vitals:   12/23/20 2045 12/24/20 0454 12/24/20 0803 12/24/20 1258  BP: 102/60 (!) 142/84  (!) 152/55  Pulse: (!) 56 66  66  Resp: 20 18  18   Temp: 98.3 F (36.8 C) 98.1 F (36.7 C)  98.4 F (36.9 C)  TempSrc: Oral Oral  Axillary  SpO2: 96% 99% 97% 97%    Intake/Output Summary (Last 24 hours) at 12/24/2020 1317 Last data filed at 12/24/2020 0501 Gross per 24 hour  Intake 600 ml  Output 300 ml  Net 300 ml   There were no vitals filed for this visit.  Examination:  General exam: Appears calm and comfortable, on room air, elderly looking,  confused Respiratory system: Bilateral coarse breath sounds noted, mild wheezing. Cardiovascular system: S1 & S2 heard, RRR. No JVD, murmurs, rubs, gallops or clicks. No pedal edema. Gastrointestinal system: Abdomen is nondistended, soft and nontender. No organomegaly or masses felt. Normal bowel sounds heard. Central nervous system: Sleepy, not arousable, not following commands, appears agitated and confused. Skin: No rashes, lesions or ulcers   Data Reviewed: I have personally reviewed following labs and imaging studies  CBC: Recent Labs  Lab 12/23/20 0309  WBC 9.3  NEUTROABS 8.4*  HGB 10.9*  HCT 33.7*  MCV 94.7  PLT 122*   Basic Metabolic Panel: Recent Labs  Lab 12/23/20 0309  NA 143  K 4.0  CL 110  CO2 22  GLUCOSE 137*  BUN 38*  CREATININE 1.85*  CALCIUM 8.9  MG 2.0   GFR: CrCl cannot be calculated (Unknown ideal weight.). Liver Function Tests: Recent Labs  Lab 12/23/20 0309  AST 19  ALT 26  ALKPHOS 113  BILITOT 0.9  PROT 6.3*  ALBUMIN 3.5   No results for input(s): LIPASE, AMYLASE in the last 168 hours. No results for input(s): AMMONIA in the last 168 hours. Coagulation Profile: No results for input(s): INR, PROTIME in the last 168 hours. Cardiac Enzymes: No results for input(s): CKTOTAL, CKMB, CKMBINDEX, TROPONINI in the last 168 hours. BNP (last 3 results) No results for input(s): PROBNP in the last 8760 hours. HbA1C: No results for input(s): HGBA1C in the last 72 hours. CBG: No results for input(s): GLUCAP in the last 168 hours. Lipid Profile: No results for input(s): CHOL, HDL, LDLCALC, TRIG, CHOLHDL, LDLDIRECT in the last 72 hours. Thyroid Function Tests: No results for input(s): TSH, T4TOTAL, FREET4, T3FREE, THYROIDAB in the last 72 hours. Anemia Panel: No results for input(s): VITAMINB12, FOLATE, FERRITIN, TIBC, IRON, RETICCTPCT in the last 72 hours. Sepsis Labs: Recent Labs  Lab 12/23/20 0309  PROCALCITON 0.13    No results found  for this or any previous visit (from the past 240 hour(s)).    Radiology Studies: DG Chest 1 View  Result Date: 12/22/2020 CLINICAL DATA:  Aspiration into airway. EXAM: CHEST  1 VIEW COMPARISON:  Radiograph earlier today. FINDINGS: Left costophrenic angle is not included in the field of view. Patchy left basilar airspace disease is similar to earlier today. There is new ill-defined right perihilar opacity. Peripheral reticulation at the right costophrenic angle appears chronic. Stable heart size and mediastinal contours. Aortic atherosclerosis. No pneumothorax. No evidence of pneumomediastinum. IMPRESSION: 1. New ill-defined right perihilar opacity, may be atelectasis or aspiration. 2. Unchanged patchy left basilar airspace disease from earlier today, may be atelectasis or pneumonia. 3. Left costophrenic angle excluded from the field of view. Patient had difficulty tolerating the exam. Electronically Signed   By: Narda Rutherford M.D.   On: 12/22/2020 23:11   DG Chest Portable 1 View  Result Date: 12/22/2020 CLINICAL DATA:  Food impaction EXAM: PORTABLE CHEST 1 VIEW COMPARISON:  None. FINDINGS: The heart is enlarged. Vascular calcifications are seen in the aortic arch. There is mild left and minimal right basilar atelectasis/airspace disease. A small  left pleural effusion may contribute. There is no right pleural effusion. There is no pneumothorax. Biapical pleuroparenchymal scarring is noted. Degenerative changes are seen in the spine. IMPRESSION: Mild left and minimal right basilar atelectasis/airspace disease. A small left pleural effusion may contribute. Electronically Signed   By: Romona Curls M.D.   On: 12/22/2020 18:57    Scheduled Meds: . arformoterol  15 mcg Nebulization BID  . aspirin EC  81 mg Oral Daily  . atorvastatin  40 mg Oral Daily  . enoxaparin (LOVENOX) injection  30 mg Subcutaneous Q24H  . irbesartan  150 mg Oral Daily  . pantoprazole  40 mg Oral Daily  . risperiDONE  0.25 mg  Oral Q1400  . umeclidinium bromide  1 puff Inhalation Daily   Continuous Infusions: . azithromycin Stopped (12/24/20 0058)  . cefTRIAXone (ROCEPHIN)  IV Stopped (12/23/20 2249)  . dextrose 5 % and 0.2 % NaCl       LOS: 1 day   Time spent: 40 minutes   Perseus Westall Estill Cotta, MD [facility]  If 7PM-7AM, please contact night-coverage [email or web address] 12/24/2020, 1:17 PM

## 2020-12-24 NOTE — Evaluation (Signed)
Clinical/Bedside Swallow Evaluation Patient Details  Name: Gabriel Austin MRN: [id number] Date of Birth: 11/23/1920  Today's Date: 12/24/2020 Time: SLP Start Time (ACUTE ONLY): 0810 SLP Stop Time (ACUTE ONLY): 0828 SLP Time Calculation (min) (ACUTE ONLY): 18 min  Past Medical History:  Past Medical History:  Diagnosis Date  . Chronic congestive heart failure (HCC) 12/22/2020  . Chronic kidney disease   . COPD (chronic obstructive pulmonary disease) (HCC)   . Coronary artery disease involving native coronary artery of native heart without angina pectoris 12/22/2020  . Esophageal stricture   . GERD without esophagitis 12/22/2020  . History of prostate cancer 12/22/2020  . Macular degeneration 12/22/2020  . Mixed hyperlipidemia 12/22/2020  . Vascular dementia with behavioral disturbance (HCC) 12/22/2020   Past Surgical History:  Past Surgical History:  Procedure Laterality Date  . BALLOON DILATION N/A 12/22/2020   Procedure: BALLOON DILATION;  Surgeon: Sherrilyn Rist, MD;  Location: [facility] ENDOSCOPY;  Service: Gastroenterology;  Laterality: N/A;  . ESOPHAGOGASTRODUODENOSCOPY (EGD) WITH PROPOFOL N/A 12/22/2020   Procedure: ESOPHAGOGASTRODUODENOSCOPY (EGD) WITH PROPOFOL;  Surgeon: Sherrilyn Rist, MD;  Location: [facility] ENDOSCOPY;  Service: Gastroenterology;  Laterality: N/A;  . FOREIGN BODY REMOVAL  12/22/2020   Procedure: FOREIGN BODY REMOVAL;  Surgeon: Sherrilyn Rist, MD;  Location: [facility] ENDOSCOPY;  Service: Gastroenterology;;   HPI:  Pt is a 87 yo male with h/o frequent bouts of food impaction due to esophageal stricture s/p numerous esophageal dilations, presenting with sudden onset chest discomfort and tightness after eating chicken and broccoli concerning for food impaction. EGD 4/28 confirmed an extensive amount of food in the entire esophagus and stenosis at the GE junction that was dilated, as well as tortuous esophagus. During the procedure, there appeared to be broccoli in the  hypopharynx just above the vocal folds and around the ETT. Pt has been cleared by GI to have up to a ground diet but indefinitely no further. PMH also includes: dementia, HLD, prostate ca, CAD, COPD, CKD, CHF.  Most recent chest xray was showing ill defined right perihilar opacity that may reflect atelectasis or aspiration, and unchanged patchy left basilar airspace disease that may reflect atelectasis or PNA.  Per notes, daughter reported that the patient had a clinical swallowing evaluation approximately 6 weeks ago with recommendation for a mechanical soft diet with thin liquids.   Assessment / Plan / Recommendation Clinical Impression  Clinical swallowing evaluation was completed using ice chips, thin liquids via spoon and pureed material.  Liquids were also attempted via cup and straw sip but due to the patient's mentation he was unable to accept bolus trials in this manner.  Cranial nerve exam was attempted and unable to be completed due to his current mentation.  He was non verbal and did not follow any commands during this evaluation.  Per care taker present prior to the esophageal issues the patient ate/drank whatever he wanted.  There were reports of a clinical swallowing evaluation approximately 6 weeks ago with recommendation for a mechanical soft diet.  Per caretaker for several days prior to admission he had been regurgitating all presented material.  He is s/p EGD with dilation with findings of broccoli pieces sitting on his vocal folds.  Currently his biggest risk factor for aspiration is his mentation.  He has decreased bolus awareness and anticipation in accepting presented bolus trials especially for liquids.  He required cues to swallow intermittently.  Swallow trigger was appreciated to palpation.  Patient noted to have intermittent throat  clear across all trials.  In addition, he appeared to swallow small ice chip whole.  Given results of this evaluation will plan to hold patient NPO  pending re evaluation to assess for readiness for PO intake with improvement in mentation. SLP Visit Diagnosis: Dysphagia, unspecified (R13.10)    Aspiration Risk  Moderate aspiration risk;Severe aspiration risk    Diet Recommendation   NPO  Medication Administration: Via alternative means    Other  Recommendations Oral Care Recommendations: Oral care QID Other Recommendations: Have oral suction available   Follow up Recommendations Other (comment) (TBD)      Frequency and Duration min 2x/week  2 weeks       Prognosis Prognosis for Safe Diet Advancement: Fair Barriers to Reach Goals: Cognitive deficits      Swallow Study   General Date of Onset: 12/22/20 HPI: Pt is a 87 yo male with h/o frequent bouts of food impaction due to esophageal stricture s/p numerous esophageal dilations, presenting with sudden onset chest discomfort and tightness after eating chicken and broccoli concerning for food impaction. EGD 4/28 confirmed an extensive amount of food in the entire esophagus and stenosis at the GE junction that was dilated, as well as tortuous esophagus. During the procedure, there appeared to be broccoli in the hypopharynx just above the vocal folds and around the ETT. Pt has been cleared by GI to have up to a ground diet but indefinitely no further. PMH also includes: dementia, HLD, prostate ca, CAD, COPD, CKD, CHF.  Most recent chest xray was showing ill defined right perihilar opacity that may reflect atelectasis or aspiration, and unchanged patchy left basdilar airspace disease that may reflect atelectasis or PNA.  Per notes, daughter reported that the patient had a clinical swallowing evaluation approximately 6 weeks ago with recommendation for a mechanical soft diet with thin liquids. Type of Study: Bedside Swallow Evaluation Previous Swallow Assessment: None noted at [facility]. Diet Prior to this Study: Other (Comment) (clears) Temperature Spikes Noted: Yes Respiratory Status: Room  air History of Recent Intubation: Yes for EGD Behavior/Cognition: Lethargic/Drowsy;Doesn't follow directions;Agitated;Confused Oral Cavity Assessment: Within Functional Limits Oral Care Completed by SLP: No Oral Cavity - Dentition: Poor condition;Missing dentition Self-Feeding Abilities: Total assist Patient Positioning: Upright in bed Baseline Vocal Quality: Not observed Volitional Cough: Cognitively unable to elicit Volitional Swallow: Unable to elicit    Oral/Motor/Sensory Function Overall Oral Motor/Sensory Function: Other (comment) (Unable to assess)   Ice Chips Ice chips: Impaired Presentation: Spoon Oral Phase Impairments: Impaired mastication Oral Phase Functional Implications: Other (comment)   Thin Liquid Thin Liquid: Impaired Presentation: Spoon;Cup;Straw Oral Phase Impairments: Poor awareness of bolus;Impaired mastication Oral Phase Functional Implications: Prolonged oral transit;Other (comment) Pharyngeal  Phase Impairments: Throat Clearing - Delayed    Nectar Thick Nectar Thick Liquid: Not tested   Honey Thick Honey Thick Liquid: Not tested   Puree Puree: Impaired Presentation: Spoon Oral Phase Impairments: Poor awareness of bolus;Impaired mastication Oral Phase Functional Implications: Oral holding;Prolonged oral transit Pharyngeal Phase Impairments: Throat Clearing - Delayed   Solid     Solid: Not tested      Dimas Aguas, MA, CCC-SLP Acute Rehab SLP [phone]  Fleet Contras 12/24/2020,8:41 AM

## 2020-12-24 NOTE — Plan of Care (Signed)
Safety sitter at bedside.   Problem: Safety: Goal: Non-violent Restraint(s) Outcome: Progressing

## 2020-12-24 NOTE — Progress Notes (Signed)
   Palliative Medicine Inpatient Follow Up Note  I spoke to patient's daughter, Gabriel Austin over the phone.  She has shared with me that she is on her way to a wedding this evening.  We have agreed to meet tomorrow at 10:30 AM to further discuss Gabriel Austin present health state and further broach the topic of hospice.  Plan: Family meeting 4/31 at 10:30 AM  No charge ______________________________________________________________________________________ Gabriel Austin [facility] Palliative Medicine Team Team Cell Phone: [phone] Please utilize secure chat with additional questions, if there is no response within 30 minutes please call the above phone number  Palliative Medicine Team providers are available by phone from 7am to 7pm daily and can be reached through the team cell phone.  Should this patient require assistance outside of these hours, please call the patient's attending physician.

## 2020-12-25 DIAGNOSIS — K222 Esophageal obstruction: Secondary | ICD-10-CM | POA: Diagnosis not present

## 2020-12-25 DIAGNOSIS — T18128A Food in esophagus causing other injury, initial encounter: Secondary | ICD-10-CM | POA: Diagnosis not present

## 2020-12-25 LAB — BASIC METABOLIC PANEL
Anion gap: 10 (ref 5–15)
BUN: 42 mg/dL — ABNORMAL HIGH (ref 8–23)
CO2: 23 mmol/L (ref 22–32)
Calcium: 8.9 mg/dL (ref 8.9–10.3)
Chloride: 114 mmol/L — ABNORMAL HIGH (ref 98–111)
Creatinine, Ser: 2.11 mg/dL — ABNORMAL HIGH (ref 0.61–1.24)
GFR, Estimated: 28 mL/min — ABNORMAL LOW (ref >60)
Glucose, Bld: 140 mg/dL — ABNORMAL HIGH (ref 70–99)
Potassium: 3.9 mmol/L (ref 3.5–5.1)
Sodium: 147 mmol/L — ABNORMAL HIGH (ref 135–145)

## 2020-12-25 LAB — CBC
HCT: 35 % — ABNORMAL LOW (ref 39.0–52.0)
Hemoglobin: 11.4 g/dL — ABNORMAL LOW (ref 13.0–17.0)
MCH: 31.1 pg (ref 26.0–34.0)
MCHC: 32.6 g/dL (ref 30.0–36.0)
MCV: 95.6 fL (ref 80.0–100.0)
Platelets: 134 10*3/uL — ABNORMAL LOW (ref 150–400)
RBC: 3.66 MIL/uL — ABNORMAL LOW (ref 4.22–5.81)
RDW: 14 % (ref 11.5–15.5)
WBC: 7.7 10*3/uL (ref 4.0–10.5)
nRBC: 0 % (ref 0.0–0.2)

## 2020-12-25 MED ORDER — HEPARIN SODIUM (PORCINE) 5000 UNIT/ML IJ SOLN
5000.0000 [IU] | Freq: Three times a day (TID) | INTRAMUSCULAR | Status: DC
  Administered 2020-12-26 – 2020-12-27 (×3): 5000 [IU] via SUBCUTANEOUS
  Filled 2020-12-25 (×3): qty 1

## 2020-12-25 MED ORDER — HEPARIN SODIUM (PORCINE) 5000 UNIT/ML IJ SOLN: 5000.0000 [IU] | Freq: Three times a day (TID) | INTRAMUSCULAR | Status: DC

## 2020-12-25 MED ORDER — OLANZAPINE 10 MG IM SOLR
5.0000 mg | Freq: Four times a day (QID) | INTRAMUSCULAR | Status: DC | PRN
  Filled 2020-12-25 (×2): qty 10

## 2020-12-25 NOTE — Progress Notes (Addendum)
PROGRESS NOTE    Gabriel Austin  YQM:[id number] DOB: 26-Nov-1920 DOA: 12/22/2020 PCP: No primary care provider on file.   Brief Narrative:  Gabriel Austin is a 87 y.o. male with PMH significant for advanced vascular dementia, HTN, CHF, CAD, CKD 3, COPD, GERD, prostate cancer(s/p prostatectomy) and frequent bouts of food impaction due to esophageal stricture with numerous esophageal dilatations in the past.  Patient was brought to the ED on 4/28 with sudden onset of chest discomfort and tightening while taking his lunch concerning for food impaction again  In the ED.  Patient was initially awake and able to speak but progressively become more somnolent. Seen emergently by GI Dr. Myrtie Neither and underwent and EGD with an attempt of disimpaction. EGD confirmed extensive amount of food in the esophagus which was removed.   Patient was admitted to hospital service for suspected aspiration event. Overnight, patient's mental status was erratic, he was restless agitated and required chemical and physical restraints.  Assessment & Plan:  Food impaction due to esophageal stricture: -Underwent EGD with removal of food impaction and dilation of esophageal stricture on 4/28 -Appreciate GIs help -Evaluated by SLP-recommend to keep him n.p.o. -He is not good candidate for tube feedings (NG/PEG tube) due to his underlying dementia/agitation.TPN could be the option for short term. -Cont. IVF.  Suspected aspiration pneumonia: Acute hypoxemic respiratory failure: -Requiring 2 L via [place] -Chest x-ray concerning for aspiration.  Cont. IV Rocephin and azithromycin. -He is evaluated by SLP recommended n.p.o.  Acute metabolic encephalopathy Vascular dementia with behavioral symptoms: -In restraints -He has Information systems manager.  Family is planning for transfer to memory care unit if remains stable on 12/26/2020. -Discontinue [name], Ativan, Benadryl-to avoid sedation.  Started on olanzapine  AKI on CKD stage  IIIb: -Likely secondary to decreased p.o. intake/dehydration -Increase IV fluids -Monitor renal function closely.  Avoid nephrotoxic medication.  Hypertension Hyperlipidemia Chronic CHF Coronary artery disease: At baseline -On aspirin, statin, irbesartan.  Not on Lasix.  He appears euvolemic on exam.  No signs of fluid overload. -His p.o. home meds are held due to n.p.o. status.  Monitor vitals closely and signs for fluid overload  GERD without esophagitis: On PPI  COPD: cont. Home inhalers -Duonebs as needed  Chronic thrombocytopenia: -Platelet stable at 134.  Goals of care: I had long discussion with patient's family (daughter, son, son-in-law and grandson) & patient's geriatric physician Dr. Leanor Rubenstein on the phone.  Discussed about polypharmacy. Benzos and other sedating medications has been stopped.  Monitor closely for 24-48 hours.  If no improvement in patient's mental status-we will discuss about other options including comfort care/hospice. Palliative care on the board-appreciate help.  DVT prophylaxis: SCD/Heparin Code Status: DNR Family Communication: Patient's family present at bedside.  Plan of care discussed with patient in length and he verbalized understanding and agreed with it.  Disposition Plan: To be determined  Consultants:   GI  Procedures:   EGD  Antimicrobials:   Azithromycin  Rocephin  Status is: Inpatient   Dispo: The patient is from: ALF              Anticipated d/c is to: ALF/memory care vs hospice              Patient currently is not medically stable to d/c.   Difficult to place patient No     Subjective: Patient seen and examined.  Appears drowsy, confused and agitated.  Sitter at the bedside.  His oxygen saturation dropped this morning and was placed  on 2 L of oxygen via nasal cannula.  Remained afebrile.  No acute events overnight.  Objective: Vitals:   12/24/20 2017 12/24/20 2054 12/25/20 0500 12/25/20 0801  BP: (!) 142/69  (!)  148/59   Pulse: 71 67 76   Resp: 20 20 19    Temp: 98.5 F (36.9 C)  98.2 F (36.8 C)   TempSrc: Axillary  Axillary   SpO2: 95% 95% 96% 96%    Intake/Output Summary (Last 24 hours) at 12/25/2020 1339 Last data filed at 12/25/2020 0005 Gross per 24 hour  Intake 789.84 ml  Output 600 ml  Net 189.84 ml   There were no vitals filed for this visit.  Examination:  General exam: Appears agitated, confused, elderly looking Caucasian male.  On nasal cannula.   Respiratory system: Bilateral coarse breath sounds noted, mild wheezing. Cardiovascular system: S1 & S2 heard, RRR. No JVD, murmurs, rubs, gallops or clicks. No pedal edema. Gastrointestinal system: Abdomen is nondistended, soft and nontender. No organomegaly or masses felt. Normal bowel sounds heard. Central nervous system: Drowsy, confused, not following commands, not arousable  skin: No rashes, lesions or ulcers   Data Reviewed: I have personally reviewed following labs and imaging studies  CBC: Recent Labs  Lab 12/23/20 0309 12/25/20 0627  WBC 9.3 7.7  NEUTROABS 8.4*  --   HGB 10.9* 11.4*  HCT 33.7* 35.0*  MCV 94.7 95.6  PLT 122* 134*   Basic Metabolic Panel: Recent Labs  Lab 12/23/20 0309 12/25/20 0627  NA 143 147*  K 4.0 3.9  CL 110 114*  CO2 22 23  GLUCOSE 137* 140*  BUN 38* 42*  CREATININE 1.85* 2.11*  CALCIUM 8.9 8.9  MG 2.0  --    GFR: CrCl cannot be calculated (Unknown ideal weight.). Liver Function Tests: Recent Labs  Lab 12/23/20 0309  AST 19  ALT 26  ALKPHOS 113  BILITOT 0.9  PROT 6.3*  ALBUMIN 3.5   No results for input(s): LIPASE, AMYLASE in the last 168 hours. No results for input(s): AMMONIA in the last 168 hours. Coagulation Profile: No results for input(s): INR, PROTIME in the last 168 hours. Cardiac Enzymes: No results for input(s): CKTOTAL, CKMB, CKMBINDEX, TROPONINI in the last 168 hours. BNP (last 3 results) No results for input(s): PROBNP in the last 8760  hours. HbA1C: No results for input(s): HGBA1C in the last 72 hours. CBG: No results for input(s): GLUCAP in the last 168 hours. Lipid Profile: No results for input(s): CHOL, HDL, LDLCALC, TRIG, CHOLHDL, LDLDIRECT in the last 72 hours. Thyroid Function Tests: No results for input(s): TSH, T4TOTAL, FREET4, T3FREE, THYROIDAB in the last 72 hours. Anemia Panel: No results for input(s): VITAMINB12, FOLATE, FERRITIN, TIBC, IRON, RETICCTPCT in the last 72 hours. Sepsis Labs: Recent Labs  Lab 12/23/20 0309  PROCALCITON 0.13    No results found for this or any previous visit (from the past 240 hour(s)).    Radiology Studies: No results found.  Scheduled Meds: . arformoterol  15 mcg Nebulization BID  . aspirin EC  81 mg Oral Daily  . atorvastatin  40 mg Oral Daily  . enoxaparin (LOVENOX) injection  30 mg Subcutaneous Q24H  . irbesartan  150 mg Oral Daily  . pantoprazole  40 mg Oral Daily  . risperiDONE  0.25 mg Oral Q1400  . umeclidinium bromide  1 puff Inhalation Daily   Continuous Infusions: . azithromycin 250 mL/hr at 12/25/20 0005  . cefTRIAXone (ROCEPHIN)  IV Stopped (12/24/20 2231)  . dextrose  5 % and 0.2 % NaCl 100 mL/hr at 12/25/20 0814     LOS: 2 days   Time spent: 40 minutes   Mariah Gerstenberger Estill Cotta, MD [facility]  If 7PM-7AM, please contact night-coverage [email or web address] 12/25/2020, 1:39 PM

## 2020-12-25 NOTE — Progress Notes (Signed)
  Speech Language Pathology Treatment: Dysphagia  Patient Details Name: Gabriel Austin MRN: [id number] DOB: 1921/04/20 Today's Date: 12/25/2020 Time: 7356-7014 SLP Time Calculation (min) (ACUTE ONLY): 13 min  Assessment / Plan / Recommendation Clinical Impression  F/u after yesterday's initial swallow assessment.  Caregiver at bedside; she assisted with repositioning and efforts to awaken Gabriel Austin - he did not open his eyes, and responded to tactile/verbal prompts by pushing and turning away from examiner.  Attempts at oral care were met with biting down on [place] and sponge.  Small bolus of water was offered to lips - there was no recognition or active attempt to accept water.  Spontaneous but non-purposeful swallow was appreciated.  Pt is just not sufficiently alert to take in any POs.   If he awakens over the course of the day, recommend offering sips/ice chips.  Noted that Palliative Care is meeting with family this morning. Our service will f/u for decisions about GOC.    HPI HPI: Pt is a 86 yo male with h/o frequent bouts of food impaction due to esophageal stricture s/p numerous esophageal dilations, presenting with sudden onset chest discomfort and tightness after eating chicken and broccoli concerning for food impaction. EGD 4/28 confirmed an extensive amount of food in the entire esophagus and stenosis at the GE junction that was dilated, as well as tortuous esophagus. During the procedure, there appeared to be broccoli in the hypopharynx just above the vocal folds and around the ETT. Pt has been cleared by GI to have up to a ground diet but indefinitely no further. PMH also includes: dementia, HLD, prostate ca, CAD, COPD, CKD, CHF.  Most recent chest xray was showing ill defined right perihilar opacity that may reflect atelectasis or aspiration, and unchanged patchy left basdilar airspace disease that may reflect atelectasis or PNA.  Per notes, daughter reported that the patient had a  clinical swallowing evaluation approximately 6 weeks ago with recommendation for a mechanical soft diet with thin liquids.      SLP Plan  Continue with current plan of care       Recommendations  Diet recommendations: NPO (sips and chips if pt awakens) Liquids provided via: Teaspoon Supervision: Full supervision/cueing for compensatory strategies Postural Changes and/or Swallow Maneuvers: Seated upright 90 degrees;Upright 30-60 min after meal                Oral Care Recommendations: Oral care QID Follow up Recommendations: None SLP Visit Diagnosis: Dysphagia, unspecified (R13.10) Plan: Continue with current plan of care       GO               Jemia Fata L. Tivis Ringer, [place] Office number [phone] Pager [phone]  Juan Quam Laurice 12/25/2020, 9:33 AM

## 2020-12-25 NOTE — Progress Notes (Signed)
MEDICATION RELATED CONSULT NOTE - FOLLOW UP   Pharmacy Consult for Medication Review for Interactions with Geriatric Patient  Assessment:  Possible interaction with olanzapine+Incruse Ellipta, and risperidone+Incruse Ellipta. Risk of increased anticholinergic effects.  Plan: Avoid combination if possible    Allergies  Allergen Reactions  . Beta Adrenergic Blockers Other (See Comments)    Lowers hr  . Codeine Nausea And Vomiting  . Reopro [Abciximab] Other (See Comments)    Lower platelet  . Pulmicort [Budesonide] Rash    Medications:  Scheduled:  . arformoterol  15 mcg Nebulization BID  . aspirin EC  81 mg Oral Daily  . atorvastatin  40 mg Oral Daily  . enoxaparin (LOVENOX) injection  30 mg Subcutaneous Q24H  . irbesartan  150 mg Oral Daily  . pantoprazole  40 mg Oral Daily  . risperiDONE  0.25 mg Oral Q1400  . umeclidinium bromide  1 puff Inhalation Daily   Infusions:  . azithromycin 250 mL/hr at 12/25/20 0005  . cefTRIAXone (ROCEPHIN)  IV Stopped (12/24/20 2231)  . dextrose 5 % and 0.2 % NaCl 100 mL/hr at 12/25/20 0814   PRN: acetaminophen, acetaminophen, ipratropium-albuterol, melatonin, OLANZapine, ondansetron (ZOFRAN) IV, polyethylene glycol     Abdulaziz Toman L. Arlester Marker, PharmD, MBA [facility] PGY2 Pharmacy Resident Weekends 7:00 am - 3:00 pm, please call [phone] 12/25/20      12:36 PM  Please check AMION for all [facility] Pharmacy phone numbers After 10:00 PM, call the Main Pharmacy [phone]

## 2020-12-25 NOTE — H&P (Addendum)
Palliative Medicine Inpatient Consult Note  Reason for consult:  " Discuss goals of care"  HPI:  Per intake H&P --Gabriel Austin a 87 y.o.malewith PMH significant for advanced vascular dementia,HTN, CHF, CAD, CKD 3, COPD,GERD,prostate cancer(s/pprostatectomy)andfrequent bouts of food impaction due to esophageal stricture with numerous esophageal dilatationsin the past. Patient was brought to the ED on 4/28 with sudden onset of chest discomfort and tightening while taking his lunch concerning for food impaction again.  Palliative care has been asked to get involved in the setting of multiple comorbid conditions and recurrent hospitalizations for food impaction.  It has been requested that we discuss goals of care with the patient's loved ones.  Clinical Assessment/Goals of Care:  *Please note that this is a verbal dictation therefore any spelling or grammatical errors are due to the "[place] One" system interpretation.  I have reviewed medical records including EPIC notes, labs and imaging, received report from bedside RN, assessed the patient.    I met with patients son, Manuelito Poage, and daughter Tiburcio Pea as well as her husband Cyndia Skeeters to further discuss diagnosis prognosis, [place], EOL wishes, disposition and options.   I introduced Palliative Medicine as specialized medical care for people living with serious illness. It focuses on providing relief from the symptoms and stress of a serious illness. The goal is to improve quality of life for both the patient and the family.  Skippy is from [place] originally though he spent a great majority of his life in the [place] area.  He is a widower, his wife passed away 12 years ago.  He had 2 children, [name] and [place].  He has 8 grandchildren and 4 great-grandchildren.  He was in [employer] and deployed on 5 missions in [place].  He went to the [employer] school and obtained a degree in  Engineer, production.  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  For enjoyment he liked being in his yard, boating, fishing, and being with his family.  He is a faithful man to some degree.  Prior to hospitalization patient had been living since February 2022 at [facility].  He has a private 24/7 caregiver named Judie Petit who helps him mobilize with a gait belt though he refuses cued to utilize any mobility aids such as a walker or cane.  At [facility] he has assistance with all basic activities of daily living.  He had been slated about a week ago to transition to [facility] memory care though there was a GI bug going around that facility so his move had been transition to this upcoming Monday.  A careful review of patient's past medical history was completed inclusive of chronic conditions such as COPD, congestive heart failure, chronic kidney disease stage III, esophageal strictures, coronary artery disease, and vascular dementia.  A review of patient's present conditions completed inclusive of patient's dysphagia and aspiration pneumonia.  We discussed that [place]'s dementia can contribute to this as can his esophageal strictures.  Discussed the discoordination that patients have when dementia progresses and their inability to appropriately swallow secretions.  Reviewed recurrence of aspiration pneumonia in such circumstances.  We reviewed delirium in the context of acute hospitalization with an underlying history of dementia.  We discussed how patients can often reach a new baseline level of functioning after discharging in the setting of such an event.  We reviewed muscular deconditioning in the setting of acute hospitalization which can be very severe.  A detailed  discussion was had today regarding advanced directives -patient's 2 children [name] and [name] make decisions for him.    Concepts specific to code status, artifical feeding and hydration, continued IV antibiotics and  rehospitalization was had.  Patient is DO NOT RESUSCITATE DO NOT INTUBATE CODE STATUS.   [name] completed a MOST prior, though she will consider completing another 1 during this hospital stay.  The difference between a aggressive medical intervention path  and a palliative comfort care path for this patient at this time was had.  [name] and [name] are not at a point where they are ready to focus on comfort focused care.  They would like to first minimize patient's polypharmacy and his sedating medications inclusive of his Benadryl and Ativan.  They would like to then see how well he does with the swallow evaluation.  They share with me that he is "fought back from worse before".  We agreed to continue with a trial of therapies for 48 hours to see if [facility] can make improvements and if he is unable to do so to reconvene to further discuss comfort focused care.  This point in time the patient's family would ideally like him to optimize his strength and go back to his assisted living with the level of support he had had prior.  This morning attempted to get patient out of bed with patient's caregiver Judie Petit present at bedside as were her son, daughter, and son-in-law.  Got patient to edge of bed though he was resisting therefore placed back in bed.  Discussed the importance of continued conversation with family and their  medical providers regarding overall plan of care and treatment options, ensuring decisions are within the context of the patients values and GOCs.  Decision Maker: Tiburcio Pea (daughter) [phone]  SUMMARY OF RECOMMENDATIONS   DNAR/DNI  Trial of therapies for 48 hours (started 5/1 - ) - continue maintenance IV fluids during this time; continuance of IV antibiotics at this time  [place] consultation to decrease offensive polypharmacy for geriatric patients  Appreciate speech therapy involvement - did not pass swallow evaluation this morning due to lack of  alertness  Appreciate Dr. Doristine Bosworth providing patient's family comprehensive medical update  Discontinue Ativan and Benadryl we will add olanzapine for agitation (regular QTc monitoring presently 452), on risperidone since admission though has been too sedated to take it. Per conversation with family [name] caused a paroxysmal response  Institute strict delirium precautions  Palliative care will continue to follow along and aid in ongoing goals of care conversations  Code Status/Advance Care Planning: DNAR/DNI   Palliative Prophylaxis:   Delirium precautions, aspiration precautions  Additional Recommendations (Limitations, Scope, Preferences):  Treat what is treatable  Psycho-social/Spiritual:   Desire for further Chaplaincy support: No  Additional Recommendations:  Education on aspiration pneumonia   Prognosis: Given patients advanced age and multiple co-morbidities and acute illness (aspiration PNA) 12 month mortality risk is exceptionally high  Discharge Planning: Unclear presenty  Vitals:   12/25/20 0500 12/25/20 0801  BP: (!) 148/59   Pulse: 76   Resp: 19   Temp: 98.2 F (36.8 C)   SpO2: 96% 96%    Intake/Output Summary (Last 24 hours) at 12/25/2020 1000 Last data filed at 12/25/2020 0005 Gross per 24 hour  Intake 789.84 ml  Output 600 ml  Net 189.84 ml   Gen:  Frail elderly M HEENT: Dry mucous membranes CV: Regular rate and rhythm  PULM: 3LPM [place] ABD: soft/nontender  EXT: No edema  Neuro: Disoriented  PPS: 10%   This conversation/these recommendations were discussed with patient primary care team, Dr. Doristine Bosworth  Time In: 1015 Time Out: 1205 Total Time: 110 Greater than 50%  of this time was spent counseling and coordinating care related to the above assessment and plan.  [place] Team Team Cell Phone: [phone] Please utilize secure chat with additional questions, if there is no response within 30 minutes  please call the above phone number  Palliative Medicine Team providers are available by phone from 7am to 7pm daily and can be reached through the team cell phone.  Should this patient require assistance outside of these hours, please call the patient's attending physician.

## 2020-12-26 DIAGNOSIS — K56699 Other intestinal obstruction unspecified as to partial versus complete obstruction: Secondary | ICD-10-CM

## 2020-12-26 DIAGNOSIS — Z66 Do not resuscitate: Secondary | ICD-10-CM

## 2020-12-26 DIAGNOSIS — Z7189 Other specified counseling: Secondary | ICD-10-CM | POA: Diagnosis not present

## 2020-12-26 DIAGNOSIS — Z515 Encounter for palliative care: Secondary | ICD-10-CM

## 2020-12-26 DIAGNOSIS — N183 Chronic kidney disease, stage 3 unspecified: Secondary | ICD-10-CM | POA: Diagnosis not present

## 2020-12-26 DIAGNOSIS — J449 Chronic obstructive pulmonary disease, unspecified: Secondary | ICD-10-CM | POA: Diagnosis not present

## 2020-12-26 DIAGNOSIS — I509 Heart failure, unspecified: Secondary | ICD-10-CM | POA: Diagnosis not present

## 2020-12-26 DIAGNOSIS — K222 Esophageal obstruction: Secondary | ICD-10-CM | POA: Diagnosis not present

## 2020-12-26 LAB — CBC
HCT: 35.1 % — ABNORMAL LOW (ref 39.0–52.0)
Hemoglobin: 11.4 g/dL — ABNORMAL LOW (ref 13.0–17.0)
MCH: 31 pg (ref 26.0–34.0)
MCHC: 32.5 g/dL (ref 30.0–36.0)
MCV: 95.4 fL (ref 80.0–100.0)
Platelets: 131 10*3/uL — ABNORMAL LOW (ref 150–400)
RBC: 3.68 MIL/uL — ABNORMAL LOW (ref 4.22–5.81)
RDW: 13.9 % (ref 11.5–15.5)
WBC: 7.1 10*3/uL (ref 4.0–10.5)
nRBC: 0 % (ref 0.0–0.2)

## 2020-12-26 LAB — BASIC METABOLIC PANEL
Anion gap: 11 (ref 5–15)
BUN: 39 mg/dL — ABNORMAL HIGH (ref 8–23)
CO2: 21 mmol/L — ABNORMAL LOW (ref 22–32)
Calcium: 8.7 mg/dL — ABNORMAL LOW (ref 8.9–10.3)
Chloride: 113 mmol/L — ABNORMAL HIGH (ref 98–111)
Creatinine, Ser: 1.8 mg/dL — ABNORMAL HIGH (ref 0.61–1.24)
GFR, Estimated: 34 mL/min — ABNORMAL LOW (ref >60)
Glucose, Bld: 155 mg/dL — ABNORMAL HIGH (ref 70–99)
Potassium: 3.8 mmol/L (ref 3.5–5.1)
Sodium: 145 mmol/L (ref 135–145)

## 2020-12-26 LAB — MAGNESIUM: Magnesium: 2.1 mg/dL (ref 1.7–2.4)

## 2020-12-26 LAB — PHOSPHORUS: Phosphorus: 3.1 mg/dL (ref 2.5–4.6)

## 2020-12-26 NOTE — Care Management Important Message (Signed)
Important Message  Patient Details  Name: Gabriel Austin MRN: [id number] Date of Birth: 10-Oct-1920   Medicare Important Message Given:  Yes     Erick Murin Stefan Church 12/26/2020, 4:34 PM

## 2020-12-26 NOTE — Progress Notes (Signed)
PT and OT working with Pt.  Family at bedside, questions and concerns addressed.

## 2020-12-26 NOTE — Progress Notes (Addendum)
Palliative Medicine Inpatient Follow Up Note  Reason for consult:  " Discuss goals of care"  HPI:  Per intake H&P --> Gabriel Smithis a 87 y.o.malewith PMH significant for advanced vascular dementia,HTN, CHF, CAD, CKD 3, COPD,GERD,prostate cancer(s/pprostatectomy)andfrequent bouts of food impaction due to esophageal stricture with numerous esophageal dilatationsin the past. Patient was brought to the ED on 4/28 with sudden onset of chest discomfort and tightening while taking his lunch concerning for food impaction again.  Palliative care has been asked to get involved in the setting of multiple comorbid conditions and recurrent hospitalizations for food impaction.  It has been requested that we discuss goals of care with the patient's loved ones.  Today's Discussion (12/26/2020):  *Please note that this is a verbal dictation therefore any spelling or grammatical errors are due to the "[place] One" system interpretation.  Chart reviewed.   I spoke to patient's bedside RN throughout the evening who shared with me that [facility] had received no sedating medications overnight.  He did have an episode of waking up and was able to name his daughter Gabriel Austin.  Otherwise there were no significant events identified for [place].  I met at bedside this morning with patient's daughter Gabriel Austin.  We reviewed the evening that Gabriel Austin had.  We reviewed the plan of care which is for another 24 hours of present interventions to see if patient can want participate in speech evaluation and to work with PT or OT.  Gabriel Austin expresses to me that she is very much in tune with the reality that Gabriel Austin is likely at the end of his life though her brother Gabriel Austin is having a very hard time coming to terms with this.  We discussed that this is truly a grieving process and everyone moves at their own pace in terms of acceptance.  I shared that the palliative team will remain involved in aid in ongoing conversations  to help the patient's family determine the best course of action for Gabriel Austin.  Discussed the importance of continued conversation with family and their  medical providers regarding overall plan of care and treatment options, ensuring decisions are within the context of the patients values and GOCs.  Provided "Hard Choices for [employer]" booklet.   Provided "Gone From My Site" booklet.  Questions and concerns addressed  ___________________________________________________________________________ Addendum: I met with [name] daughter this evening.  We reviewed [name]'s current mental state.  We discussed the findings from speech therapy, physical therapy, and Occupational Therapy.  We further reviewed that [facility] despite [name] has not made tremendous progress today.  Emil asked a variety of questions regarding comfort care and hospice care.  I was able to share with her the differences between inpatient end-of-life focused care and home hospice care.  We reviewed that one of the bigger hard stops right now is the difficulty her brother Gabriel Austin is having in terms of processing his father's poor clinical state presently.  We have agreed to meet tomorrow at 1:00 for further goals of care conversations with both Gabriel Austin and he will present.  Time in: 1633 Time out: 1701 Total Additional Time 28 minutes  Objective Assessment: Vital Signs Vitals:   12/25/20 2121 12/26/20 0844  BP:    Pulse:    Resp:    Temp:    SpO2: 99% 98%    Intake/Output Summary (Last 24 hours) at 12/26/2020 0901 Last data filed at 12/26/2020 0029 Gross per 24 hour  Intake 2319.16 ml  Output 500 ml  Net  1819.16 ml    Gen:  Frail elderly M HEENT: Dry mucous membranes CV: Regular rate and rhythm  PULM: 3LPM [place] ABD: soft/nontender  EXT: No edema  Neuro: Disoriented  SUMMARY OF RECOMMENDATIONS DNAR/DNI  Trial of therapies for 48 hours (started 5/1 - ) - continue maintenance IV fluids during this time;  continuance of IV antibiotics at this time  Appreciate pharmacy consultation to decrease offensive polypharmacy - Discontinue Ativan and Benadryl on 5/1  Appreciate speech therapy involvement - Plan to see patient again today  Added olanzapine for agitation - regular QTc monitoring presently 452, on risperidone since admission though has been too sedated to take it. Per conversation with family [name] caused a paroxysmal response  Institute strict delirium precautions  Palliative care will continue to follow along and aid in ongoing goals of care conversations  Time Spent: 25 Greater than 50% of the time was spent in counseling and coordination of care ______________________________________________________________________________________ [place] Team Team Cell Phone: [phone] Please utilize secure chat with additional questions, if there is no response within 30 minutes please call the above phone number  Palliative Medicine Team providers are available by phone from 7am to 7pm daily and can be reached through the team cell phone.  Should this patient require assistance outside of these hours, please call the patient's attending physician.

## 2020-12-26 NOTE — Evaluation (Signed)
Physical Therapy Evaluation Patient Details Name: Gabriel Austin MRN: [id number] DOB: 1920/11/24 Today's Date: 12/26/2020   History of Present Illness  87 year old male with past medical history of advanced vascular dementia, COPD, CAD, prostate cancer (S/P prostatectomy), CKD III, GERD, HTN, CHF and frequent bouts of food impaction due to esophageal stricture with numerous esophageal dilatations presenting to [facility] emergency department with sudden onset of chest discomfort and tightness concerning for food impaction. Found to have esophageal impaction that took GI 95 minutes to clear.    Clinical Impression  Pt admitted with above. Per family pt was amb on own last Wednesday and then had a procedure done on Thursday and hasn't been the same since. At this time pt requiring max/totalAx2 for all transfers and ADLs, and is also unable to ambulate at this time. Spoke extensively with daughter about  SNF vs comfort care. Plan is to wait for speech therapy swallow assessment. Acute PT to cont to follow as the family desires or until medical plan is changed.    Follow Up Recommendations Other (comment) (SNF unless family decides to go to route of comfort care)    Equipment Recommendations  Other (comment) (TBD)    Recommendations for Other Services       Precautions / Restrictions Precautions Precautions: Fall Restrictions Weight Bearing Restrictions: No      Mobility  Bed Mobility Overal bed mobility: Needs Assistance Bed Mobility: Supine to Sit;Sit to Supine     Supine to sit: Total assist;+2 for physical assistance Sit to supine: Total assist;+2 for physical assistance        Transfers Overall transfer level: Needs assistance   Transfers: Sit to/from Stand Sit to Stand: Max assist;Total assist;+2 physical assistance         General transfer comment: Max A +2 first attempt to stand from elevated bed with achieving ~2/3 fully upright, Total A +2 2nd attempt  achieving ~1/4 fully upright  Ambulation/Gait                Stairs            Wheelchair Mobility    Modified Rankin (Stroke Patients Only)       Balance Overall balance assessment: Needs assistance Sitting-balance support: No upper extremity supported;Bilateral upper extremity supported;Single extremity supported;Feet supported Sitting balance-Leahy Scale: Poor Sitting balance - Comments: varied from poor to zero (with patient pushing posteriorly at times), occasional contact guard for brief stints of time   Standing balance support: Bilateral upper extremity supported Standing balance-Leahy Scale: Zero Standing balance comment: dependent on physical assist                             Pertinent Vitals/Pain Pain Assessment: Faces Faces Pain Scale: Hurts little more Pain Location: unable to state; moans and groans with bed mobility,sitting EOB, and sit<>stand Pain Descriptors / Indicators: Grimacing;Moaning Pain Intervention(s): Monitored during session    Home Living Family/patient expects to be discharged to:: Unsure (SNF vs comfort care) Living Arrangements: Non-relatives/Friends Available Help at Discharge: Family;Personal care attendant;Available 24 hours/day Type of Home: Assisted living         Home Equipment: None Additional Comments: Was suppose to transition to memory care facility soon    Prior Function Level of Independence: Needs assistance   Gait / Transfers Assistance Needed: min guard A to ambulate without AD (was resistant to any AD)  ADL's / Homemaking Assistance Needed: Needed A for B/D/used Depends  as well  Comments: pt was ambulating last wednesday     Hand Dominance   Dominant Hand: Right    Extremity/Trunk Assessment   Upper Extremity Assessment Upper Extremity Assessment: Generalized weakness    Lower Extremity Assessment Lower Extremity Assessment: Generalized weakness (stiff/rigid)    Cervical / Trunk  Assessment Cervical / Trunk Assessment: Kyphotic  Communication   Communication: Expressive difficulties;Receptive difficulties (pt kept eyes closed, muffled/soft spoken speech that was difficulty to understand)  Cognition Arousal/Alertness: Lethargic Behavior During Therapy: Restless Overall Cognitive Status: Impaired/Different from baseline Area of Impairment: Following commands;Safety/judgement;Awareness;Problem solving                       Following Commands: Follows one step commands inconsistently Safety/Judgement: Decreased awareness of safety;Decreased awareness of deficits Awareness: Intellectual Problem Solving: Slow processing;Decreased initiation;Difficulty sequencing;Requires verbal cues;Requires tactile cues General Comments: A couple of times he had his eyes open (once at sitting EOB and once when we layed him back down). PTA dtr reports you could have conversation with him and he was interactive. He could feed himself, ambulate and A some with basic ADLs      General Comments General comments (skin integrity, edema, etc.): pt with small skin tear on back, RN notified    Exercises     Assessment/Plan    PT Assessment Patient needs continued PT services  PT Problem List Decreased strength;Decreased range of motion;Decreased activity tolerance;Decreased balance;Decreased mobility;Decreased coordination;Decreased cognition;Decreased knowledge of use of DME;Decreased safety awareness       PT Treatment Interventions DME instruction;Gait training;Functional mobility training;Therapeutic activities;Stair training;Therapeutic exercise;Balance training;Neuromuscular re-education    PT Goals (Current goals can be found in the Care Plan section)  Acute Rehab PT Goals Patient Stated Goal: family is hopeful he can swallow/eat and be able to get around on his own again PT Goal Formulation: With family Time For Goal Achievement: 01/09/21 Potential to Achieve Goals:  Good    Frequency Min 2X/week   Barriers to discharge Decreased caregiver support was moving out of ALF to memory care unit, now unsure of d/c    Co-evaluation PT/OT/SLP Co-Evaluation/Treatment: Yes Reason for Co-Treatment: Complexity of the patient's impairments (multi-system involvement) PT goals addressed during session: Mobility/safety with mobility OT goals addressed during session: Strengthening/ROM;ADL's and self-care       AM-PAC PT "6 Clicks" Mobility  Outcome Measure Help needed turning from your back to your side while in a flat bed without using bedrails?: Total Help needed moving from lying on your back to sitting on the side of a flat bed without using bedrails?: Total Help needed moving to and from a bed to a chair (including a wheelchair)?: Total Help needed standing up from a chair using your arms (e.g., wheelchair or bedside chair)?: Total Help needed to walk in hospital room?: Total Help needed climbing 3-5 steps with a railing? : Total 6 Click Score: 6    End of Session Equipment Utilized During Treatment: Gait belt Activity Tolerance: Patient limited by fatigue;Patient limited by lethargy Patient left: in bed;with call bell/phone within reach;with bed alarm set;with family/visitor present Nurse Communication: Mobility status PT Visit Diagnosis: Unsteadiness on feet (R26.81);Difficulty in walking, not elsewhere classified (R26.2)    Time: 4742-5956 PT Time Calculation (min) (ACUTE ONLY): 31 min   Charges:   PT Evaluation $PT Eval Moderate Complexity: 1 Mod          Lewis Shock, PT, DPT Acute Rehabilitation Services Pager #: [phone] Office #: [phone]  Amiree No M Jefferey Lippmann 12/26/2020, 2:55 PM

## 2020-12-26 NOTE — Plan of Care (Signed)
Family at bedside, Palliative care spoke to them this morning   Problem: Safety: Goal: Non-violent Restraint(s) Outcome: Progressing   Problem: Education: Goal: Knowledge of General Education information will improve Description: Including pain rating scale, medication(s)/side effects and non-pharmacologic comfort measures Outcome: Progressing   Problem: Health Behavior/Discharge Planning: Goal: Ability to manage health-related needs will improve Outcome: Progressing   Problem: Clinical Measurements: Goal: Ability to maintain clinical measurements within normal limits will improve Outcome: Progressing Goal: Will remain free from infection Outcome: Progressing Goal: Diagnostic test results will improve Outcome: Progressing Goal: Respiratory complications will improve Outcome: Progressing Goal: Cardiovascular complication will be avoided Outcome: Progressing   Problem: Activity: Goal: Risk for activity intolerance will decrease Outcome: Progressing   Problem: Nutrition: Goal: Adequate nutrition will be maintained Outcome: Progressing   Problem: Coping: Goal: Level of anxiety will decrease Outcome: Progressing   Problem: Elimination: Goal: Will not experience complications related to bowel motility Outcome: Progressing Goal: Will not experience complications related to urinary retention Outcome: Progressing   Problem: Pain Managment: Goal: General experience of comfort will improve Outcome: Progressing   Problem: Safety: Goal: Ability to remain free from injury will improve Outcome: Progressing   Problem: Skin Integrity: Goal: Risk for impaired skin integrity will decrease Outcome: Progressing

## 2020-12-26 NOTE — Progress Notes (Signed)
  Speech Language Pathology Treatment: Dysphagia  Patient Details Name: Gabriel Austin MRN: [id number] DOB: 26-Jan-1921 Today's Date: 12/26/2020 Time: 1696-7893 SLP Time Calculation (min) (ACUTE ONLY): 35 min  Assessment / Plan / Recommendation Clinical Impression  Pt seen with daughter, son-in-law present and pt's son and PCP on speaker phone re: swallow function and treatment plan given pt's esophageal function and mental status. Significant dried mucous debrided at posterior pharynx and tongue base. Respiratory/pharyngeal congestion audible. Cognitive status warranted various techniques of oral acceptance with piping small amount from straw, cup sips and verbal cueing. Eyes closed but verbally responsive and participatory with cues. Swallow onset appeared delayed from subjective view with delayed throat clears. Discussed pt's risk and likely compromised airway protection in conjunction with dementia and poor esophageal function as relayed from daughter and chart. Daughter reports "they were unable to stretch esophagus". Family, including PCP are in agreement to initiate comfort feeds and risk aspiration. Family aware of poor nutritional status. Therapist provided education re: positioning and swallow strategies. Initiate full liquids (primarily given esophagea impairments and cognitive deficits). Questions answered and ST will sign off at this time.    HPI HPI: Pt is a 87 yo male with h/o frequent bouts of food impaction due to esophageal stricture s/p numerous esophageal dilations, presenting with sudden onset chest discomfort and tightness after eating chicken and broccoli concerning for food impaction. EGD 4/28 confirmed an extensive amount of food in the entire esophagus and stenosis at the GE junction that was dilated, as well as tortuous esophagus. During the procedure, there appeared to be broccoli in the hypopharynx just above the vocal folds and around the ETT. Pt has been cleared by GI to have  up to a ground diet but indefinitely no further. PMH also includes: dementia, HLD, prostate ca, CAD, COPD, CKD, CHF.  Most recent chest xray was showing ill defined right perihilar opacity that may reflect atelectasis or aspiration, and unchanged patchy left basdilar airspace disease that may reflect atelectasis or PNA.  Per notes, daughter reported that the patient had a clinical swallowing evaluation approximately 6 weeks ago with recommendation for a mechanical soft diet with thin liquids.      SLP Plan  Discharge SLP treatment due to (comment)       Recommendations  Diet recommendations: Other(comment);Thin liquid (full liquids due to stricture) Liquids provided via: Cup;Straw Medication Administration: Crushed with puree Supervision: Full supervision/cueing for compensatory strategies Compensations: Minimize environmental distractions;Slow rate;Small sips/bites Postural Changes and/or Swallow Maneuvers: Seated upright 90 degrees;Upright 30-60 min after meal                Oral Care Recommendations: Oral care QID Follow up Recommendations: None SLP Visit Diagnosis: Dysphagia, unspecified (R13.10) Plan: Discharge SLP treatment due to (comment)       GO                Gabriel Austin 12/26/2020, 12:02 PM   Breck Coons Lonell Face.Ed Nurse, children's [phone] Office [phone]

## 2020-12-26 NOTE — Progress Notes (Signed)
PROGRESS NOTE    Gabriel Austin  GYJ:[id number] DOB: 06/24/1921 DOA: 12/22/2020 PCP: No primary care provider on file.   Brief Narrative:  Gabriel Austin is a 87 y.o. male with PMH significant for advanced vascular dementia, HTN, CHF, CAD, CKD 3, COPD, GERD, prostate cancer(s/p prostatectomy) and frequent bouts of food impaction due to esophageal stricture with numerous esophageal dilatations in the past presented to hospital with sudden onset of chest discomfort and tightening after taking lunch.  In the ED patient was initially able to speak but progressively became somnolent so emergently underwent EGD with disimpaction.  There was  extensive amount of food in the esophagus which was removed.    Then admitted hospital for aspiration event and had subsequent agitation and required chemical and physical restraints.  Palliative care was consulted during hospitalization and currently discussion and planning for further goals of care underway.  Assessment & Plan:  Food impaction due to esophageal stricture: Patient underwent EGD with removal of food impaction and dilation of the esophageal stricture on 12/22/2020.  Has been having trouble swallowing and a speech therapy has recommended n.p.o. due to high risk for aspiration.  Not a good candidate for PEG tube feeding due to underlying dementia and agitation.  Continue D5 half normal saline for now.  Palliative care on board for further discussion on goals of care.   Suspected aspiration pneumonia with acute hypoxemic respiratory failure: Currently requiring 3 L of oxygen by nasal cannula.  Highly concerning for aspiration.  On Rocephin and Zithromax.  Speech therapy recommended n.p.o. status.  Acute metabolic encephalopathy with underlying Vascular dementia with behavioral symptoms: Patient does have 24-hour Engineer, site.  Family is planning to transfer to memory care unit if possible but at this time goals of care under discussion.  Patient is  currently off [name], Ativan, Benadryl-to avoid sedation.  Patient has been started on olanzapine  AKI on CKD stage IIIb: On IV fluid hydration at this time.  Closely monitor. Lab Results  Component Value Date   CREATININE 1.80 (H) 12/26/2020   CREATININE 2.11 (H) 12/25/2020   CREATININE 1.85 (H) 12/23/2020    Essential hypertension./Chronic CHF/Coronary artery disease:  Currently compensated.  GERD without esophagitis: Continue PPI.   COPD: Continue DuoNebs.  Has some rattling breath sounds.  Chronic thrombocytopenia: Mildly low at 131.  Closely monitor.  Goals of care: I again had a prolonged discussion with the patient's daughter at bedside.  Palliative care on board regarding further goals of care discussion, and disposition plan  DVT prophylaxis:  Heparin subcu  Code Status: DNR  Family Communication:  Spoke with the patient's daughter at bedside.   Disposition Plan:  To be determined  Consultants:   GI  Procedures:   Upper GI endoscopy  Antimicrobials:   Azithromycin 4/29>  Rocephin 4/29>  Status is: Inpatient   Dispo: The patient is from: ALF              Anticipated d/c is to: ALF/memory care vs hospice.  Palliative care on board.              Patient currently is not medically stable to d/c.   Difficult to place patient No   Subjective: Today patient was seen and examined at bedside.  Patient's daughter at bedside.  Patient is little more alert as per the patient's daughter including little calmer..  Objective: Vitals:   12/25/20 1600 12/25/20 2111 12/25/20 2121 12/26/20 0844  BP: (!) 155/101 (!) 180/89    Pulse:  65 65    Resp: 15 16    Temp: 98.4 F (36.9 C)     TempSrc:      SpO2: 100%  99% 98%    Intake/Output Summary (Last 24 hours) at 12/26/2020 1006 Last data filed at 12/26/2020 0029 Gross per 24 hour  Intake 2319.16 ml  Output 500 ml  Net 1819.16 ml   There were no vitals filed for this visit.  Physical  examination:  General:  Average built, not in obvious distress alert awake, mildly communicative, elderly male ,rattling breath sounds HENT:   No scleral pallor or icterus noted. Oral mucosa is moist.  Chest: Decreased breath sounds bilaterally.  Right leg without CVS: S1 &S2 heard. No murmur.  Regular rate and rhythm. Abdomen: Soft, nontender, nondistended.  Bowel sounds are heard.  On waist restraint. Extremities: No cyanosis, clubbing or edema.  Peripheral pulses are palpable. Psych: Mildly communicative, confused, CNS:  No cranial nerve deficits.  Power equal in all extremities.   Skin: Warm and dry.  No rashes noted.  Data Reviewed:  I have personally reviewed the following labs and imaging studies   CBC: Recent Labs  Lab 12/23/20 0309 12/25/20 0627 12/26/20 0618  WBC 9.3 7.7 7.1  NEUTROABS 8.4*  --   --   HGB 10.9* 11.4* 11.4*  HCT 33.7* 35.0* 35.1*  MCV 94.7 95.6 95.4  PLT 122* 134* 131*   Basic Metabolic Panel: Recent Labs  Lab 12/23/20 0309 12/25/20 0627 12/26/20 0618  NA 143 147* 145  K 4.0 3.9 3.8  CL 110 114* 113*  CO2 22 23 21*  GLUCOSE 137* 140* 155*  BUN 38* 42* 39*  CREATININE 1.85* 2.11* 1.80*  CALCIUM 8.9 8.9 8.7*  MG 2.0  --  2.1  PHOS  --   --  3.1   GFR: CrCl cannot be calculated (Unknown ideal weight.). Liver Function Tests: Recent Labs  Lab 12/23/20 0309  AST 19  ALT 26  ALKPHOS 113  BILITOT 0.9  PROT 6.3*  ALBUMIN 3.5   No results for input(s): LIPASE, AMYLASE in the last 168 hours. No results for input(s): AMMONIA in the last 168 hours. Coagulation Profile: No results for input(s): INR, PROTIME in the last 168 hours. Cardiac Enzymes: No results for input(s): CKTOTAL, CKMB, CKMBINDEX, TROPONINI in the last 168 hours. BNP (last 3 results) No results for input(s): PROBNP in the last 8760 hours. HbA1C: No results for input(s): HGBA1C in the last 72 hours. CBG: No results for input(s): GLUCAP in the last 168 hours. Lipid  Profile: No results for input(s): CHOL, HDL, LDLCALC, TRIG, CHOLHDL, LDLDIRECT in the last 72 hours. Thyroid Function Tests: No results for input(s): TSH, T4TOTAL, FREET4, T3FREE, THYROIDAB in the last 72 hours. Anemia Panel: No results for input(s): VITAMINB12, FOLATE, FERRITIN, TIBC, IRON, RETICCTPCT in the last 72 hours. Sepsis Labs: Recent Labs  Lab 12/23/20 0309  PROCALCITON 0.13    No results found for this or any previous visit (from the past 240 hour(s)).    Radiology Studies: No results found.  Scheduled Meds: . arformoterol  15 mcg Nebulization BID  . aspirin EC  81 mg Oral Daily  . atorvastatin  40 mg Oral Daily  . heparin injection (subcutaneous)  5,000 Units Subcutaneous Q8H  . pantoprazole  40 mg Oral Daily  . risperiDONE  0.25 mg Oral Q1400  . umeclidinium bromide  1 puff Inhalation Daily   Continuous Infusions: . azithromycin 250 mL/hr at 12/26/20 0029  . cefTRIAXone (  ROCEPHIN)  IV Stopped (12/25/20 2231)  . dextrose 5 % and 0.2 % NaCl Stopped (12/25/20 2348)     LOS: 3 days   Joycelyn Das, MD [facility] 12/26/2020, 10:06 AM

## 2020-12-26 NOTE — Evaluation (Signed)
Occupational Therapy Evaluation Patient Details Name: Gabriel Austin MRN: [id number] DOB: 1920-09-14 Today's Date: 12/26/2020    History of Present Illness 87 year old male with past medical history of advanced vascular dementia, COPD, CAD, prostate cancer (S/P prostatectomy), CKD III, GERD, HTN, CHF and frequent bouts of food impaction due to esophageal stricture with numerous esophageal dilatations presenting to [facility] emergency department with sudden onset of chest discomfort and tightness concerning for food impaction. Found to have esophageal impaction that took GI 95 minutes to clear.   Clinical Impression   This 87 yo male admitted with and underwent above presents to acute OT with PLOF of being min guard A when ambulating without any AD, able to help some with basic ADLs as directed, could feed self, and was interactive with his caregiver, staff, and family. Currently he is total A for all basic ADLs and Mod A-total A for all mobility with being unable to achieve full upright standing or transfer with +2 A. He will continue to benefit from acute OT with follow up at SNF.    Follow Up Recommendations  SNF;Supervision/Assistance - 24 hour    Equipment Recommendations  None recommended by OT       Precautions / Restrictions Precautions Precautions: Fall Restrictions Weight Bearing Restrictions: No      Mobility Bed Mobility Overal bed mobility: Needs Assistance Bed Mobility: Supine to Sit;Sit to Supine     Supine to sit: Total assist;+2 for physical assistance Sit to supine: Total assist;+2 for physical assistance        Transfers Overall transfer level: Needs assistance   Transfers: Sit to/from Stand Sit to Stand: Max assist;Total assist;+2 physical assistance         General transfer comment: Max A +2 first attempt to stand from elevated bed with achieving ~2/3 fully upright, Total A +2 2nd attempt achieving ~1/4 fully upright    Balance Overall  balance assessment: Needs assistance Sitting-balance support: No upper extremity supported;Bilateral upper extremity supported;Single extremity supported;Feet supported   Sitting balance - Comments: varied from poor to zero (with patient pushing posteriorly at times)   Standing balance support: Bilateral upper extremity supported Standing balance-Leahy Scale: Zero                             ADL either performed or assessed with clinical judgement   ADL                                         General ADL Comments: total A     Vision   Additional Comments: Pt kept eyes closed 99% of session            Pertinent Vitals/Pain Pain Assessment: Faces Faces Pain Scale: Hurts whole lot Pain Location: unable to state; moans and groans with bed mobility,sitting EOB, and sit<>stand Pain Descriptors / Indicators: Grimacing;Moaning Pain Intervention(s): Limited activity within patient's tolerance;Monitored during session;Repositioned     Hand Dominance Right   Extremity/Trunk Assessment Upper Extremity Assessment Upper Extremity Assessment: Generalized weakness           Communication Communication Communication: No difficulties   Cognition Arousal/Alertness: Lethargic Behavior During Therapy: Restless Overall Cognitive Status: Impaired/Different from baseline Area of Impairment: Following commands;Safety/judgement;Awareness;Problem solving  Following Commands: Follows one step commands inconsistently Safety/Judgement: Decreased awareness of safety;Decreased awareness of deficits Awareness: Intellectual Problem Solving: Slow processing;Decreased initiation;Difficulty sequencing;Requires verbal cues;Requires tactile cues General Comments: A couple of times he had his eyes open (once at sitting EOB and once when we layed him back down). PTA dtr reports you could have conversation with him and he was interactive. He  could feed himself, ambulate and A some with basic ADLs              Home Living Family/patient expects to be discharged to:: Skilled nursing facility Living Arrangements: Non-relatives/Friends Available Help at Discharge: Family;Personal care attendant;Available 24 hours/day Type of Home: Assisted living                       Home Equipment: None   Additional Comments: Was suppose to transition to memory care facility soon      Prior Functioning/Environment Level of Independence: Needs assistance  Gait / Transfers Assistance Needed: min guard A to ambulate without AD (was resistant to any AD) ADL's / Homemaking Assistance Needed: Needed A for B/D/used Depends as well            OT Problem List: Impaired balance (sitting and/or standing);Impaired vision/perception;Decreased coordination;Decreased cognition;Decreased safety awareness;Pain      OT Treatment/Interventions: Self-care/ADL training;DME and/or AE instruction;Patient/family education;Balance training    OT Goals(Current goals can be found in the care plan section) Acute Rehab OT Goals Patient Stated Goal: family is hopeful he can swallow/eat and be able to get around on his own again OT Goal Formulation: With family Time For Goal Achievement: 01/09/21 Potential to Achieve Goals: Fair  OT Frequency: Min 2X/week           Co-evaluation PT/OT/SLP Co-Evaluation/Treatment: Yes Reason for Co-Treatment: For patient/therapist safety;Necessary to address cognition/behavior during functional activity PT goals addressed during session: Mobility/safety with mobility;Balance;Strengthening/ROM OT goals addressed during session: Strengthening/ROM;ADL's and self-care      AM-PAC OT "6 Clicks" Daily Activity     Outcome Measure Help from another person eating meals?: Total (NPO) Help from another person taking care of personal grooming?: Total Help from another person toileting, which includes using toliet,  bedpan, or urinal?: Total Help from another person bathing (including washing, rinsing, drying)?: Total Help from another person to put on and taking off regular upper body clothing?: Total Help from another person to put on and taking off regular lower body clothing?: Total 6 Click Score: 6   End of Session Equipment Utilized During Treatment: Gait belt Nurse Communication: Mobility status (skin tear on his back)  Activity Tolerance: Patient tolerated treatment well Patient left: in bed;with call bell/phone within reach;with family/visitor present (restraints not applied while family in room)  OT Visit Diagnosis: Unsteadiness on feet (R26.81);Other abnormalities of gait and mobility (R26.89);Muscle weakness (generalized) (M62.81);Pain;Other symptoms and signs involving cognitive function;Cognitive communication deficit (R41.841);Adult, failure to thrive (R62.7) Symptoms and signs involving cognitive functions: Other cerebrovascular disease Pain - part of body:  (unknown where)                Time: 8527-7824 OT Time Calculation (min): 31 min Charges:  OT General Charges $OT Visit: 1 Visit OT Evaluation $OT Eval Moderate Complexity: 1 Mod  Ignacia Palma, OTR/L Acute [employer] Pager [phone] Office [phone]     Evette Georges 12/26/2020, 11:32 AM

## 2020-12-27 ENCOUNTER — Inpatient Hospital Stay (HOSPITAL_COMMUNITY): Payer: Medicare Other

## 2020-12-27 DIAGNOSIS — Z7189 Other specified counseling: Secondary | ICD-10-CM

## 2020-12-27 DIAGNOSIS — R0602 Shortness of breath: Secondary | ICD-10-CM | POA: Diagnosis not present

## 2020-12-27 DIAGNOSIS — K219 Gastro-esophageal reflux disease without esophagitis: Secondary | ICD-10-CM | POA: Diagnosis not present

## 2020-12-27 DIAGNOSIS — K222 Esophageal obstruction: Secondary | ICD-10-CM | POA: Diagnosis not present

## 2020-12-27 DIAGNOSIS — R0989 Other specified symptoms and signs involving the circulatory and respiratory systems: Secondary | ICD-10-CM | POA: Diagnosis not present

## 2020-12-27 DIAGNOSIS — Z515 Encounter for palliative care: Secondary | ICD-10-CM

## 2020-12-27 LAB — BASIC METABOLIC PANEL
Anion gap: 12 (ref 5–15)
BUN: 43 mg/dL — ABNORMAL HIGH (ref 8–23)
CO2: 17 mmol/L — ABNORMAL LOW (ref 22–32)
Calcium: 8.5 mg/dL — ABNORMAL LOW (ref 8.9–10.3)
Chloride: 115 mmol/L — ABNORMAL HIGH (ref 98–111)
Creatinine, Ser: 1.89 mg/dL — ABNORMAL HIGH (ref 0.61–1.24)
GFR, Estimated: 32 mL/min — ABNORMAL LOW (ref >60)
Glucose, Bld: 127 mg/dL — ABNORMAL HIGH (ref 70–99)
Potassium: 3.6 mmol/L (ref 3.5–5.1)
Sodium: 144 mmol/L (ref 135–145)

## 2020-12-27 LAB — MAGNESIUM: Magnesium: 2.1 mg/dL (ref 1.7–2.4)

## 2020-12-27 LAB — PHOSPHORUS: Phosphorus: 3.2 mg/dL (ref 2.5–4.6)

## 2020-12-27 MED ORDER — POLYVINYL ALCOHOL 1.4 % OP SOLN: 1.0000 [drp] | Freq: Four times a day (QID) | OPHTHALMIC | Status: DC | PRN

## 2020-12-27 MED ORDER — BIOTENE DRY MOUTH MT LIQD: 15.0000 mL | OROMUCOSAL | Status: DC | PRN

## 2020-12-27 MED ORDER — GLYCOPYRROLATE 0.2 MG/ML IJ SOLN
0.4000 mg | INTRAMUSCULAR | Status: DC | PRN
  Administered 2020-12-27 – 2020-12-28 (×3): 0.4 mg via INTRAVENOUS
  Filled 2020-12-27 (×4): qty 2

## 2020-12-27 NOTE — Progress Notes (Signed)
Palliative Medicine Inpatient Follow Up Note  Reason for consult:  " Discuss goals of care"  HPI:  Per intake H&P --> Gabriel Smithis a 87 y.o.malewith PMH significant for advanced vascular dementia,HTN, CHF, CAD, CKD 3, COPD,GERD,prostate cancer(s/pprostatectomy)andfrequent bouts of food impaction due to esophageal stricture with numerous esophageal dilatationsin the past. Patient was brought to the ED on 4/28 with sudden onset of chest discomfort and tightening while taking his lunch concerning for food impaction again.  Palliative care has been asked to get involved in the setting of multiple comorbid conditions and recurrent hospitalizations for food impaction.  It has been requested that we discuss goals of care with the patient's loved ones.  Today's Discussion (12/27/2020):  Chart reviewed.   I spoke to patient's bedside RN Gabriel Austin who reported no changes in patient's status since yesterday. Assessed patient at the bedside. He is pleasantly confused, denies pain or distress. His son Gabriel Austin, daughter Gabriel Austin, and son-in-law Gabriel Austin are present at the bedside. At family's request, family meeting with Gabriel Austin and Gabriel Austin was moved into the conference room and patient's PCP Dr. Leanor Austin was called to participate as well.  We reviewed [place]'s ongoing lethargy and recent updates from the medical team. Gabriel Austin expressed that he understands the severity of patient's illness after spending the past day and night at the bedside. He states "dad will barely open his eyes" and speaks to his understanding of the poor prognosis of likely weeks to months. We discussed patient's increased agitation with blood draws, vital signs, turns, heparin injections, etc. Discussed concerns for difficult SNF placement if these behaviors continue. Family states the patient is easily consoled in their presence for personal care. They would to like to pursue this option with the addition of hospice resources.   A comfort  focused care plan was discussed at length in light of family's goals of care and family was counseled that this is certainly appropriate today if they are ready. They agree to transition to modified comfort care at this time to avoid unnecessary discomfort, given progressive nature of patient's comorbidities and evidence of more harm than benefit. Discussed options for treating symptoms and focusing on quality of life. Family notes patient has continued to suffer with excessive secretions. Educated on management with nursing care and medications.  Family would like to continue IV antibiotics and fluids to stabilize the patient prior to discharge with hospice. At Dr. Rolinda Roan recommendation, a plan was made to repeat CXR one more time to determine whether patient's aspiration is felt to be recurrent and chronic. Discussed the risks and benefits of other discharge options, including residential hospice and home with hospice.        Questions and concerns addressed. PMT will continue to support holistically.   Objective Assessment: Vital Signs Vitals:   12/27/20 0847 12/27/20 1446  BP:  136/67  Pulse:  61  Resp:  20  Temp:  97.9 F (36.6 C)  SpO2: 99% 99%    Intake/Output Summary (Last 24 hours) at 12/27/2020 1456 Last data filed at 12/27/2020 1300 Gross per 24 hour  Intake 2788.68 ml  Output 400 ml  Net 2388.68 ml     SUMMARY OF RECOMMENDATIONS  Transition to modified comfort care today - continue of maintenance IV fluids and IV antibiotics at this time. Discontinue blood draws, frequent vital signs, heparin injections. Liberalize visitation.  Ordered oral suction PRN and Robinul PRN for excessive secretions  Discussed family's request for final CXR with Dr. Gwenlyn Austin  Continue avoiding overly sedating  medications - family is appreciate of precious moments of clarity to spend quality time with patient  Continue comfort feeds as discussed with SLP yesterday   Continue strict delirium  precautions  *Family's goal is SNF with hospice, preferably in [place]. They are also open to residential hospice if he is eligible. If patient is no longer at risk of getting out of bed unassisted, they are willing to consider home with hospice.Discussed with TOC, assistance appreciated.  Palliative care will continue to follow along and aid in ongoing goals of care conversations. Will meet with family again on Thursday 5/5  Time Spent: 50 minutes Greater than 50% of the time was spent in counseling and coordination of care ______________________________________________________________________________________ Richardson Dopp PA-C [facility] Palliative Medicine Team Team Cell Phone: [phone] Please utilize secure chat with additional questions, if there is no response within 30 minutes please call the above phone number  Palliative Medicine Team providers are available by phone from 7am to 7pm daily and can be reached through the team cell phone.  Should this patient require assistance outside of these hours, please call the patient's attending physician.

## 2020-12-27 NOTE — TOC Progression Note (Signed)
Transition of Care [facility]) - Progression Note    Patient Details  Name: Gabriel Austin MRN: [id number] Date of Birth: 1920/09/21  [facility] CM/SW Contact  Erin Sons, [place] Phone Number: 12/27/2020, 3:49 PM  Clinical Narrative:     CSW called daughter [name] to discuss disposition. CSW confirmed that family desires to go to SNF with Hospice. CSW explains the limits of medicare coverage and that medicare will only cover SNF for rehab services and that pt would not be able to have hospice while at a SNF that is being covered by medicare. CSW explains potential options including pt going to home with hospice and private care givers or paying out of pocket for long term care at snf with hospice. Pt daughter plans to discuss further with her family about her options.   Expected Discharge Plan: Memory Care Barriers to Discharge: No Barriers Identified  Expected Discharge Plan and Services Expected Discharge Plan: Memory Care       Living arrangements for the past 2 months: Assisted Living Facility                                       Social Determinants of Health (SDOH) Interventions    Readmission Risk Interventions No flowsheet data found.

## 2020-12-27 NOTE — Progress Notes (Signed)
PROGRESS NOTE    Gabriel Austin  ELF:[id number] DOB: [birth year] DOA: 12/22/2020 PCP: No primary care provider on file.   Brief Narrative:  Gabriel Austin is a 87 y.o. male with PMH significant for advanced vascular dementia, HTN, CHF, CAD, CKD 3, COPD, GERD, prostate cancer(s/p prostatectomy) and frequent bouts of food impaction due to esophageal stricture with numerous esophageal dilatations in the past presented to hospital with sudden onset of chest discomfort and tightening after taking lunch.  In the ED patient was initially able to speak but progressively became somnolent so emergently underwent EGD with disimpaction.  There was  extensive amount of food in the esophagus which was removed.    Then admitted hospital for aspiration event and had subsequent agitation and required chemical and physical restraints.  Palliative care was consulted during hospitalization and currently discussion and planning for further goals of care underway.  Assessment & Plan:  Food impaction due to esophageal stricture: -Patient underwent EGD with removal of food impaction and dilation of the esophageal stricture on 12/22/2020.   -Continue PPI. -Has been having trouble swallowing and current mentation making impossible for thorough speech therapy evaluation. -Not a good candidate for PEG tube feeding due to underlying dementia and agitation.  -Continue D5 half normal saline for now.   -Palliative care on board for further discussion on goals of care and recommendations.   Suspected aspiration pneumonia with acute hypoxemic respiratory failure: -Currently requiring 3-4 L of oxygen by nasal cannula.   -Highly concerning for aspiration; when she remains at high risk for given underlying dementia and ongoing intermittent mentation changes. -On Rocephin and Zithromax.   -Continue to follow speech therapy recommendations -Repeat chest x-ray.  Acute metabolic encephalopathy with underlying Vascular dementia  with behavioral symptoms: -Patient does have 24-hour private sitter.   -Family is planning to transfer to memory care unit if possible but at this time goals of care under discussion.   -Poor overall prognosis decrease chances for purposeful rehabilitation  -Continue olanzapine and follow palliative care recommendations.    AKI on CKD stage IIIb: -On IV fluid hydration at this time.  Closely monitor. -IV fluid rate has been adjusted.  Continue to follow volume status. Lab Results  Component Value Date   CREATININE 1.89 (H) 12/27/2020   CREATININE 1.80 (H) 12/26/2020   CREATININE 2.11 (H) 12/25/2020    Essential hypertension/Chronic diastolic CHF/Coronary artery disease:  -Euvolemic and currently compensated. -continue to follow I's & O's and volume status.  GERD without esophagitis: -Continue PPI.   COPD: -Continue DuoNebs.   -Positive rattling and rhonchi's on exam.  Chronic thrombocytopenia: -Mildly low at 131.   -Continue to follow intermittently.  Goals of care:  -Long discussion regarding prognosis and current clinical updates provided to patient's daughter at bedside.   -Appears not to be a great candidate for rehabilitation, still requiring oxygen supplementation, with mild intermittent episodes of agitation and poor oral intake. -Poor overall outcome anticipated -Appreciate assistance and recommendations by palliative care service.  DVT prophylaxis:  -Heparin subcu  Code Status: DNR  Family Communication:  -Spoke with the patient's daughter at bedside.  -Full clinical updates provided  Disposition Plan:  To be determined; family inclining for skilled nursing facility with palliative care/hospice follow-up.  Consultants:   GI  Procedures:   Upper GI endoscopy  Antimicrobials:   Azithromycin 4/29>  Rocephin 4/29>  Status is: Inpatient   Dispo: The patient is from: ALF  Anticipated d/c is to: ALF/memory care vs hospice.  Palliative  care on board.              Patient currently is not medically stable to d/c.   Difficult to place patient No   Subjective: No fever, no nausea, no vomiting, still with decreased oral intake.  Using 3-4 L nasal cannula supplementation and per nursing staff with mild intermittent agitation.  No combativeness.  During my evaluation Sleepy/somnolent without much participation.  Objective: Vitals:   12/26/20 2204 12/27/20 0348 12/27/20 0847 12/27/20 1446  BP: (!) 174/81 (!) 176/74  136/67  Pulse: 66 72  61  Resp: 18 19  20   Temp: 99.3 F (37.4 C)   97.9 F (36.6 C)  TempSrc: Oral   Oral  SpO2: 100% 98% 99% 99%    Intake/Output Summary (Last 24 hours) at 12/27/2020 1702 Last data filed at 12/27/2020 1635 Gross per 24 hour  Intake 2908.68 ml  Output 400 ml  Net 2508.68 ml   There were no vitals filed for this visit.  Physical examination: General exam: Afebrile, in no major distress; somnolent/sleepy at time of evaluation.  No participating much with his care during nursing staff assessment and reports.  No nausea or vomiting reported. Respiratory system: Positive rhonchi bilaterally; no using accessory muscle.  Continue requiring 3-4 L nasal cannula supplementation. Cardiovascular system: Regular rate and rhythm; no rubs or gallops appreciated on exam.  No JVD. Gastrointestinal system: Abdomen is nondistended, soft and nontender. No organomegaly or masses felt. Normal bowel sounds heard. Central nervous system: Unable to properly assess with current mentation; moving 4 limbs spontaneously.   Extremities: No cyanosis or clubbing. Skin: No petechiae. Psychiatry: Stable mood currently; no overnight events and not combativeness  Reported. Still mildly agitated at times.   Data Reviewed:  I have personally reviewed the following labs and imaging studies   CBC: Recent Labs  Lab 12/23/20 0309 12/25/20 0627 12/26/20 0618  WBC 9.3 7.7 7.1  NEUTROABS 8.4*  --   --   HGB 10.9* 11.4*  11.4*  HCT 33.7* 35.0* 35.1*  MCV 94.7 95.6 95.4  PLT 122* 134* 131*   Basic Metabolic Panel: Recent Labs  Lab 12/23/20 0309 12/25/20 0627 12/26/20 0618 12/27/20 0254  NA 143 147* 145 144  K 4.0 3.9 3.8 3.6  CL 110 114* 113* 115*  CO2 22 23 21* 17*  GLUCOSE 137* 140* 155* 127*  BUN 38* 42* 39* 43*  CREATININE 1.85* 2.11* 1.80* 1.89*  CALCIUM 8.9 8.9 8.7* 8.5*  MG 2.0  --  2.1 2.1  PHOS  --   --  3.1 3.2   Liver Function Tests: Recent Labs  Lab 12/23/20 0309  AST 19  ALT 26  ALKPHOS 113  BILITOT 0.9  PROT 6.3*  ALBUMIN 3.5   Sepsis Labs: Recent Labs  Lab 12/23/20 0309  PROCALCITON 0.13    No results found for this or any previous visit (from the past 240 hour(s)).    Radiology Studies: DG CHEST PORT 1 VIEW  Result Date: 12/27/2020 CLINICAL DATA:  Shortness of breath, history coronary artery disease, vascular dementia, COPD, chronic kidney disease, prostate cancer EXAM: PORTABLE CHEST 1 VIEW COMPARISON:  Portable exam 1512 hours compared to 12/22/2020 FINDINGS: Normal heart size, mediastinal contours, and pulmonary vascularity. Atherosclerotic calcification aorta. Minimal atelectasis RIGHT base. LEFT basilar infiltrate with question additional mild infiltrate in LEFT upper lobe as well. RIGHT apex scarring. No pleural effusion or pneumothorax. Bones demineralized. IMPRESSION: LEFT  basilar infiltrate with question of additional mild infiltrate LEFT upper lobe. RIGHT apex scarring and minimal RIGHT basilar atelectasis. Electronically Signed   By: Ulyses Southward M.D.   On: 12/27/2020 16:03    Scheduled Meds: . arformoterol  15 mcg Nebulization BID  . aspirin EC  81 mg Oral Daily  . pantoprazole  40 mg Oral Daily  . risperiDONE  0.25 mg Oral Q1400  . umeclidinium bromide  1 puff Inhalation Daily   Continuous Infusions: . azithromycin 500 mg (12/27/20 0033)  . cefTRIAXone (ROCEPHIN)  IV Stopped (12/26/20 2252)  . dextrose 5 % and 0.2 % NaCl 1,000 mL (12/27/20 0541)      LOS: 4 days   Vassie Loll, MD [facility] 12/27/2020, 5:02 PM

## 2020-12-28 DIAGNOSIS — N183 Chronic kidney disease, stage 3 unspecified: Secondary | ICD-10-CM | POA: Diagnosis not present

## 2020-12-28 DIAGNOSIS — K222 Esophageal obstruction: Secondary | ICD-10-CM | POA: Diagnosis not present

## 2020-12-28 DIAGNOSIS — I509 Heart failure, unspecified: Secondary | ICD-10-CM | POA: Diagnosis not present

## 2020-12-28 DIAGNOSIS — R0602 Shortness of breath: Secondary | ICD-10-CM

## 2020-12-28 DIAGNOSIS — T17908A Unspecified foreign body in respiratory tract, part unspecified causing other injury, initial encounter: Secondary | ICD-10-CM

## 2020-12-28 LAB — QUANTIFERON-TB GOLD PLUS (RQFGPL)
QuantiFERON Mitogen Value: >10 IU/mL
QuantiFERON Nil Value: 0.05 IU/mL
QuantiFERON TB1 Ag Value: 0.03 IU/mL
QuantiFERON TB2 Ag Value: 0.03 IU/mL

## 2020-12-28 LAB — QUANTIFERON-TB GOLD PLUS: QuantiFERON-TB Gold Plus: NEGATIVE

## 2020-12-28 MED ORDER — RISPERIDONE 0.5 MG PO TBDP
0.2500 mg | ORAL_TABLET | Freq: Two times a day (BID) | ORAL | Status: DC
  Administered 2020-12-28 – 2020-12-29 (×2): 0.25 mg via ORAL
  Filled 2020-12-28 (×3): qty 0.5

## 2020-12-28 MED ORDER — LORAZEPAM 2 MG/ML IJ SOLN: 1.0000 mg | INTRAMUSCULAR | Status: DC | PRN

## 2020-12-28 MED ORDER — HALOPERIDOL 0.5 MG PO TABS
0.5000 mg | ORAL_TABLET | ORAL | Status: DC | PRN
  Filled 2020-12-28: qty 1

## 2020-12-28 MED ORDER — RISPERIDONE 0.5 MG PO TBDP
0.2500 mg | ORAL_TABLET | Freq: Two times a day (BID) | ORAL | Status: DC
  Filled 2020-12-28: qty 0.5

## 2020-12-28 MED ORDER — HALOPERIDOL LACTATE 2 MG/ML PO CONC
0.5000 mg | ORAL | Status: DC | PRN
  Filled 2020-12-28: qty 0.3

## 2020-12-28 MED ORDER — HALOPERIDOL LACTATE 5 MG/ML IJ SOLN: 0.5000 mg | INTRAMUSCULAR | Status: DC | PRN

## 2020-12-28 MED ORDER — LORAZEPAM 1 MG PO TABS: 1.0000 mg | ORAL_TABLET | ORAL | Status: DC | PRN

## 2020-12-28 MED ORDER — LORAZEPAM 2 MG/ML PO CONC
1.0000 mg | ORAL | Status: DC | PRN
Start: 2020-12-28 — End: 2020-12-30

## 2020-12-28 NOTE — Progress Notes (Signed)
Arrived to assess for placement of PIV. Just in touching the patient's arm. Pt aggressively resisting and fighting back. Pt has received medicine prior to PIV assessment. Daughter at bedside along with nurse and techs. Tomasita Morrow, RN VAST

## 2020-12-28 NOTE — Progress Notes (Signed)
Spoke with the patient's daughter Ms. [name] at bedside again.  Spoke with the patient's geriatrician Dr Leanor Rubenstein.  We had prolonged discussion about goals of care.  Patient's daughter does not wish the patient to suffer more.  We discussed about comfort care and at this time patient will be transition to comfort care.

## 2020-12-28 NOTE — Progress Notes (Signed)
Daughter is a bedside. Declining IV team to place PIV at this time. Maurine Minister RN at bedside. Tomasita Morrow, RN VAST

## 2020-12-28 NOTE — TOC Progression Note (Signed)
Transition of Care [facility]) - Progression Note    Patient Details  Name: Gabriel Austin MRN: [id number] Date of Birth: [birth year]  Transition of [facility]) CM/SW Contact  Jimmy Picket, [place] Phone Number: 12/28/2020, 4:34 PM  Clinical Narrative:     CSW spoke pts daughter and son. Family requested pt send pt out to SNF facilities in the [place] area. Family wants to see if pt will be accepted to SNF before they consider Hospice options.   CSW will follow up about bed offers.   Expected Discharge Plan: Memory Care Barriers to Discharge: No Barriers Identified  Expected Discharge Plan and Services Expected Discharge Plan: Memory Care       Living arrangements for the past 2 months: Assisted Living Facility                                       Social Determinants of Health (SDOH) Interventions    Readmission Risk Interventions No flowsheet data found.  Jimmy Picket, Theresia Majors, [place] Clinical Social Worker [phone]

## 2020-12-28 NOTE — Plan of Care (Signed)
  Problem: Clinical Measurements: Goal: Ability to maintain clinical measurements within normal limits will improve Outcome: Progressing Blood pressure (!) 149/63, pulse 61, temperature (!) 96.7 F (35.9 C), temperature source Axillary, resp. rate 15, SpO2 97 %.  Disoriented x4, Painad 0. Agressive during care, incomprehensible speech.  Pt at bed side.  8127: Notified attending "Pts daughter requesting you to coordinate with the pt Geriatric doctor Delon Sacramento in evaluating Risperdal dosage either increase dosage or and put it on prn. In addition pt right index finger is swollen intact but , red and warm to touch" 0830: Daughter doesn't want zyprexa administered and she prefers lorazepam especially when attempting to do the IV 1119: RN & MD coordinated with pharmacy and risperdal changed to sublingual and increased to BID. 1130: RN requested SW to meet with family per their request.  1400:  attempt to insert IV however pt was agitated. RN had extensive talk with pt family in regards to the necesity of IV related to Modified comfort care.  1530: RN cordinated Geriatric, pts daughter and attending to interact. RN also reiterated what the new full comfort measures entails to the daughter and son.  1641: RN got orders to insert indwelling catheter for comfort, explained and educated on the necessity of the catheter for comfort care. Advised the family they have autonmy on the deciding if they want the cathetre  By the end of shift Son stated they would like to confirm with their geriatric if it is ok for the catheter.  Advised palliative stated they will check on having under the age of 60 great grand kids coming to visit.   Currently pt calm listening to music and son at bedside.

## 2020-12-28 NOTE — Progress Notes (Addendum)
PROGRESS NOTE    Gabriel Austin  OIT:[id number] DOB: 05-28-1921 DOA: 12/22/2020 PCP: No primary care provider on file.   Brief Narrative:  Gabriel Austin is a 87 y.o. male with PMH significant for advanced vascular dementia, HTN, CHF, CAD, CKD 3, COPD, GERD, prostate cancer(s/p prostatectomy) and frequent bouts of food impaction due to esophageal stricture with numerous esophageal dilatations in the past presented to hospital with sudden onset of chest discomfort and tightening after taking lunch.  In the ED patient was initially able to speak but progressively became somnolent so emergently underwent EGD with disimpaction.  There was  extensive amount of food in the esophagus which was removed.   The patient was admitted hospital for aspiration event and had subsequent agitation and required chemical and physical restraints.  During hospitalization, palliative care was consulted and hospice level of care has been discussed at this time.  Assessment & Plan:  Food impaction due to esophageal stricture: Patient underwent EGD with removal of food impaction and dilation of the esophageal stricture on 12/22/2020.  Has been having trouble swallowing, speech therapy has seen the patient and recommend full liquids at this time.  Not a good candidate for PEG tube feeding due to underlying dementia and agitation.  Continue D5 half normal saline for now.  Palliative care on board and plan is hospice level of care on discharge.  Suspected aspiration pneumonia with acute hypoxemic respiratory failure: Currently requiring 3 L of oxygen by nasal cannula.  Highly concerning for aspiration.  On Rocephin and Zithromax.  Speech therapy recommends full liquids at this time.  Acute metabolic encephalopathy with underlying Vascular dementia with behavioral symptoms: Patient does have 24-hour Engineer, site.   Patient is currently off [name], Ativan, Benadryl-to avoid sedation.  Was on zyprexa but family wished not to  be on it.  We will talk with pharmacy to see if we can initiate risperidone sublingually. I also spoke with the patient's geriatrician about the plan of care.  AKI on CKD stage IIIb: Received IV fluid hydration during hospitalization. Now refusing IV access.   Lab Results  Component Value Date   CREATININE 1.89 (H) 12/27/2020   CREATININE 1.80 (H) 12/26/2020   CREATININE 2.11 (H) 12/25/2020    Essential hypertension./Chronic CHF/Coronary artery disease:  Currently compensated.  GERD without esophagitis: Continue PPI.   COPD: Continue DuoNebs, oxygen supplementation and supportive care.  Chronic thrombocytopenia: Mildly low at 131.    Goals of care: Palliative care on board.  Plan for hospice level of care on discharge.  DVT prophylaxis:  Heparin subcu  Code Status: DNR  Family Communication:  I  spoke with the patient's daughter at bedside about the goals of care.  Consultants:   GI  Procedures:   Upper GI endoscopy  Antimicrobials:   Azithromycin 4/29>  Rocephin 4/29>  Status is: Inpatient  Dispo: The patient is from: ALF              Anticipated d/c is to: Likely to hospice.  Final disposition uncertain so far..  Palliative care on board.              Patient currently is not medically stable to d/c.   Difficult to place patient No   Subjective:  Today, patient was seen and examined at bedside.  Patient's daughter at bedside.  Requests risperidone instead of Ativan and [name].  Agitated at times.  Objective: Vitals:   12/27/20 0348 12/27/20 0847 12/27/20 1446 12/27/20 2323  BP: (!) 176/74  136/67 (!) 149/63  Pulse: 72  61 61  Resp: 19  20 15   Temp:   97.9 F (36.6 C) (!) 96.7 F (35.9 C)  TempSrc:   Oral Axillary  SpO2: 98% 99% 99% 97%    Intake/Output Summary (Last 24 hours) at 12/28/2020 1253 Last data filed at 12/28/2020 0654 Gross per 24 hour  Intake 2215.29 ml  Output --  Net 2215.29 ml   There were no vitals filed for this  visit.  Physical Examination: General:  Average built,  elderly male, mildly agitated but mostly somnolent, on nasal cannula oxygen HENT:   No scleral pallor or icterus noted. Oral mucosa is moist.  Chest: Decreased breath sounds bilaterally.  Bilateral mild rhonchi noted. CVS: S1 &S2 heard. No murmur.  Regular rate and rhythm. Abdomen: Soft, nontender, nondistended.  Bowel sounds are heard.   Extremities: No cyanosis, clubbing or edema.  Peripheral pulses are palpable.  Index finger of right hand mildly erythematous and swollen. Psych: Alert, awake confused, CNS: Moves all extremities Skin: Warm and dry.  No rashes noted.  Data Reviewed:  I have personally reviewed the following labs and imaging studies   CBC: Recent Labs  Lab 12/23/20 0309 12/25/20 0627 12/26/20 0618  WBC 9.3 7.7 7.1  NEUTROABS 8.4*  --   --   HGB 10.9* 11.4* 11.4*  HCT 33.7* 35.0* 35.1*  MCV 94.7 95.6 95.4  PLT 122* 134* 131*   Basic Metabolic Panel: Recent Labs  Lab 12/23/20 0309 12/25/20 0627 12/26/20 0618 12/27/20 0254  NA 143 147* 145 144  K 4.0 3.9 3.8 3.6  CL 110 114* 113* 115*  CO2 22 23 21* 17*  GLUCOSE 137* 140* 155* 127*  BUN 38* 42* 39* 43*  CREATININE 1.85* 2.11* 1.80* 1.89*  CALCIUM 8.9 8.9 8.7* 8.5*  MG 2.0  --  2.1 2.1  PHOS  --   --  3.1 3.2   GFR: CrCl cannot be calculated (Unknown ideal weight.). Liver Function Tests: Recent Labs  Lab 12/23/20 0309  AST 19  ALT 26  ALKPHOS 113  BILITOT 0.9  PROT 6.3*  ALBUMIN 3.5   No results for input(s): LIPASE, AMYLASE in the last 168 hours. No results for input(s): AMMONIA in the last 168 hours. Coagulation Profile: No results for input(s): INR, PROTIME in the last 168 hours. Cardiac Enzymes: No results for input(s): CKTOTAL, CKMB, CKMBINDEX, TROPONINI in the last 168 hours. BNP (last 3 results) No results for input(s): PROBNP in the last 8760 hours. HbA1C: No results for input(s): HGBA1C in the last 72 hours. CBG: No  results for input(s): GLUCAP in the last 168 hours. Lipid Profile: No results for input(s): CHOL, HDL, LDLCALC, TRIG, CHOLHDL, LDLDIRECT in the last 72 hours. Thyroid Function Tests: No results for input(s): TSH, T4TOTAL, FREET4, T3FREE, THYROIDAB in the last 72 hours. Anemia Panel: No results for input(s): VITAMINB12, FOLATE, FERRITIN, TIBC, IRON, RETICCTPCT in the last 72 hours. Sepsis Labs: Recent Labs  Lab 12/23/20 0309  PROCALCITON 0.13    No results found for this or any previous visit (from the past 240 hour(s)).    Radiology Studies: DG CHEST PORT 1 VIEW  Result Date: 12/27/2020 CLINICAL DATA:  Shortness of breath, history coronary artery disease, vascular dementia, COPD, chronic kidney disease, prostate cancer EXAM: PORTABLE CHEST 1 VIEW COMPARISON:  Portable exam 1512 hours compared to 12/22/2020 FINDINGS: Normal heart size, mediastinal contours, and pulmonary vascularity. Atherosclerotic calcification aorta. Minimal atelectasis RIGHT base. LEFT basilar infiltrate with question  additional mild infiltrate in LEFT upper lobe as well. RIGHT apex scarring. No pleural effusion or pneumothorax. Bones demineralized. IMPRESSION: LEFT basilar infiltrate with question of additional mild infiltrate LEFT upper lobe. RIGHT apex scarring and minimal RIGHT basilar atelectasis. Electronically Signed   By: Ulyses Southward M.D.   On: 12/27/2020 16:03    Scheduled Meds: . arformoterol  15 mcg Nebulization BID  . aspirin EC  81 mg Oral Daily  . pantoprazole  40 mg Oral Daily  . risperiDONE  0.25 mg Oral Q1400  . umeclidinium bromide  1 puff Inhalation Daily   Continuous Infusions: . azithromycin 500 mg (12/27/20 2316)  . cefTRIAXone (ROCEPHIN)  IV 2 g (12/27/20 2137)  . dextrose 5 % and 0.2 % NaCl 100 mL/hr at 12/28/20 0654     LOS: 5 days   Joycelyn Das, MD [facility] 12/28/2020, 12:53 PM

## 2020-12-28 NOTE — NC FL2 (Signed)
[place] MEDICAID FL2 LEVEL OF CARE SCREENING TOOL     IDENTIFICATION  Patient Name: Gabriel Austin Birthdate: 30-Dec-1920 Sex: male Admission Date (Current Location): 12/22/2020  [facility] and [place] Number:  Producer, television/film/video and Address:  [facility] [place], [address], [place], [place] [address]      Provider Number: [id number]  Attending Physician Name and Address:  Joycelyn Das, MD  Relative Name and Phone Number:       Current Level of Care: Hospital Recommended Level of Care: Skilled Nursing Facility Prior Approval Number:    Date Approved/Denied:   PASRR Number: pending  Discharge Plan: SNF    Current Diagnoses: Patient Active Problem List   Diagnosis Date Noted  . SOB (shortness of breath)   . Goals of care, counseling/discussion   . Palliative care by specialist   . Food bolus obstruction of intestine (HCC) 12/23/2020  . Esophageal obstruction due to food impaction 12/22/2020  . CKD (chronic kidney disease), stage III (HCC) 12/22/2020  . Vascular dementia with behavioral disturbance (HCC) 12/22/2020  . Suspected pulmonary aspiration of food 12/22/2020  . Coronary artery disease involving native coronary artery of native heart without angina pectoris 12/22/2020  . Mixed hyperlipidemia 12/22/2020  . GERD without esophagitis 12/22/2020  . COPD (chronic obstructive pulmonary disease) (HCC) 12/22/2020  . History of prostate cancer 12/22/2020  . Macular degeneration 12/22/2020  . Chronic congestive heart failure (HCC) 12/22/2020    Orientation RESPIRATION BLADDER Height & Weight     Self  Normal Incontinent,External catheter Weight:   Height:     BEHAVIORAL SYMPTOMS/MOOD NEUROLOGICAL BOWEL NUTRITION STATUS      Incontinent Diet (See DC summary)  AMBULATORY STATUS COMMUNICATION OF NEEDS Skin   Extensive Assist Verbally PU Stage and Appropriate Care (buttocks) PU Stage 1 Dressing: BID                     Personal Care  Assistance Level of Assistance  Bathing,Feeding,Dressing Bathing Assistance: Maximum assistance Feeding assistance: Maximum assistance Dressing Assistance: Maximum assistance Total Care Assistance: Maximum assistance   Functional Limitations Info  Sight,Hearing,Speech Sight Info: Adequate Hearing Info: Impaired Speech Info: Adequate    SPECIAL CARE FACTORS FREQUENCY  PT (By licensed PT),OT (By licensed OT)     PT Frequency: 5x a week OT Frequency: 5x a week            Contractures Contractures Info: Not present    Additional Factors Info  Code Status,Allergies Code Status Info: DNR Allergies Info: Beta Adrenergic Blockers   Codeine   Reopro (Abciximab)   Pulmicort (Budesonide)           Current Medications (12/28/2020):  This is the current hospital active medication list Current Facility-Administered Medications  Medication Dose Route Frequency Provider Last Rate Last Admin  . acetaminophen (TYLENOL) suppository 650 mg  650 mg Rectal Q8H PRN Lorin Glass, MD   650 mg at 12/23/20 1852  . arformoterol (BROVANA) nebulizer solution 15 mcg  15 mcg Nebulization BID Marinda Elk, MD   15 mcg at 12/27/20 2036  . glycopyrrolate (ROBINUL) injection 0.4 mg  0.4 mg Intravenous Q4H PRN Cooper, Josseline P, PA-C   0.4 mg at 12/28/20 5176  . haloperidol ([name]) tablet 0.5 mg  0.5 mg Oral Q4H PRN Pokhrel, Laxman, MD       Or  . haloperidol ([name]) 2 MG/ML solution 0.5 mg  0.5 mg Sublingual Q4H PRN Pokhrel, Rebekah Chesterfield, MD  Or  . haloperidol lactate ([name]) injection 0.5 mg  0.5 mg Intravenous Q4H PRN Pokhrel, Laxman, MD      . ipratropium-albuterol (DUONEB) 0.5-2.5 (3) MG/3ML nebulizer solution 3 mL  3 mL Nebulization Q4H PRN Shalhoub, Deno Lunger, MD      . LORazepam (ATIVAN) tablet 1 mg  1 mg Oral Q4H PRN Pokhrel, Laxman, MD       Or  . LORazepam (ATIVAN) 2 MG/ML concentrated solution 1 mg  1 mg Sublingual Q4H PRN Pokhrel, Laxman, MD       Or  . LORazepam (ATIVAN) injection  1 mg  1 mg Intravenous Q4H PRN Pokhrel, Laxman, MD      . ondansetron (ZOFRAN) injection 4 mg  4 mg Intravenous Q6H PRN Charlie Pitter III, MD      . pantoprazole (PROTONIX) EC tablet 40 mg  40 mg Oral Daily Shalhoub, Deno Lunger, MD   40 mg at 12/27/20 1011  . polyethylene glycol (MIRALAX / GLYCOLAX) packet 17 g  17 g Oral Daily PRN Charlie Pitter III, MD      . polyvinyl alcohol (LIQUIFILM TEARS) 1.4 % ophthalmic solution 1 drop  1 drop Both Eyes QID PRN Cooper, Josseline P, PA-C      . risperiDONE (RISPERDAL M-TABS) disintegrating tablet 0.25 mg  0.25 mg Oral BID Pokhrel, Laxman, MD   0.25 mg at 12/28/20 1503  . umeclidinium bromide (INCRUSE ELLIPTA) 62.5 MCG/INH 1 puff  1 puff Inhalation Daily Shalhoub, Deno Lunger, MD         Discharge Medications: Please see discharge summary for a list of discharge medications.  Relevant Imaging Results:  Relevant Lab Results:   Additional Information SSN: [id number]  Jimmy Picket, [place]

## 2020-12-29 DIAGNOSIS — R0989 Other specified symptoms and signs involving the circulatory and respiratory systems: Secondary | ICD-10-CM | POA: Diagnosis not present

## 2020-12-29 DIAGNOSIS — R0602 Shortness of breath: Secondary | ICD-10-CM | POA: Diagnosis not present

## 2020-12-29 DIAGNOSIS — F0151 Vascular dementia with behavioral disturbance: Secondary | ICD-10-CM | POA: Diagnosis not present

## 2020-12-29 DIAGNOSIS — I509 Heart failure, unspecified: Secondary | ICD-10-CM | POA: Diagnosis not present

## 2020-12-29 DIAGNOSIS — N183 Chronic kidney disease, stage 3 unspecified: Secondary | ICD-10-CM | POA: Diagnosis not present

## 2020-12-29 DIAGNOSIS — K222 Esophageal obstruction: Secondary | ICD-10-CM | POA: Diagnosis not present

## 2020-12-29 DIAGNOSIS — J449 Chronic obstructive pulmonary disease, unspecified: Secondary | ICD-10-CM | POA: Diagnosis not present

## 2020-12-29 MED ORDER — GLYCOPYRROLATE 1 MG PO TABS: 1.0000 mg | ORAL_TABLET | ORAL | Status: DC

## 2020-12-29 MED ORDER — MORPHINE SULFATE 10 MG/5ML PO SOLN
2.5000 mg | ORAL | Status: DC
  Filled 2020-12-29: qty 2

## 2020-12-29 MED ORDER — MORPHINE SULFATE (CONCENTRATE) 10 MG/0.5ML PO SOLN
5.0000 mg | ORAL | Status: DC
Start: 2020-12-29 — End: 2020-12-30
  Administered 2020-12-29 – 2020-12-30 (×7): 5 mg via SUBLINGUAL
  Filled 2020-12-29 (×7): qty 0.5

## 2020-12-29 MED ORDER — GLYCOPYRROLATE 1 MG PO TABS
1.0000 mg | ORAL_TABLET | ORAL | Status: DC
  Filled 2020-12-29 (×3): qty 1

## 2020-12-29 MED ORDER — ATROPINE SULFATE 1 % OP SOLN
3.0000 [drp] | OPHTHALMIC | Status: DC | PRN
  Administered 2020-12-29 – 2020-12-30 (×3): 3 [drp] via SUBLINGUAL
  Filled 2020-12-29 (×2): qty 2

## 2020-12-29 MED ORDER — GLYCOPYRROLATE 0.2 MG/ML IJ SOLN
0.4000 mg | INTRAMUSCULAR | Status: DC
  Administered 2020-12-29: 0.4 mg via SUBCUTANEOUS
  Filled 2020-12-29: qty 2

## 2020-12-29 MED ORDER — RISPERIDONE 0.5 MG PO TBDP
0.2500 mg | ORAL_TABLET | Freq: Three times a day (TID) | ORAL | Status: DC
  Administered 2020-12-29 – 2020-12-30 (×3): 0.25 mg via ORAL
  Filled 2020-12-29 (×5): qty 0.5

## 2020-12-29 MED ORDER — SCOPOLAMINE 1 MG/3DAYS TD PT72
1.0000 | MEDICATED_PATCH | TRANSDERMAL | Status: DC
  Administered 2020-12-29: 1.5 mg via TRANSDERMAL
  Filled 2020-12-29: qty 1

## 2020-12-29 MED ORDER — GLYCOPYRROLATE 0.2 MG/ML IJ SOLN: 0.2000 mg | INTRAMUSCULAR | Status: DC

## 2020-12-29 MED ORDER — GLYCOPYRROLATE 1 MG PO TABS
1.0000 mg | ORAL_TABLET | ORAL | Status: DC
Start: 2020-12-29 — End: 2020-12-30
  Administered 2020-12-29 – 2020-12-30 (×5): 1 mg via ORAL
  Filled 2020-12-29 (×12): qty 1

## 2020-12-29 NOTE — Progress Notes (Signed)

## 2020-12-29 NOTE — Progress Notes (Signed)
Palliative care brief note  Received call with concern that Gabriel Austin is having difficulty with swallowing and requesting consider changing morphine concentration to decrease volume he needs to swallow.    Changed morphine to 10mg /0.31ml concentrated solution. Also made addition of atropine drops as concern he will be able to tolerate robinul PO.  4m, MD [facility] Palliative Medicine Team [phone]  NO CHARGE NOTE

## 2020-12-29 NOTE — Progress Notes (Signed)
Pt took SL med without issue. He had his eyes open and communicated verbally about taking the medication. Pt was compliant.  Able to move pt up in bed with another nurse without incident, pt calm and cooperative in the morning.  Lungs starting to collect fluid in lower and middle lobes. Tried to administer PO robinul crushed in vanilla ice cream. Pt refused to swallow a tiny bite of ice cream without med. Family did not want subq robinul administered and pt does not have an IV.  Asked Palliative about atropine drops or scopolamine patch to help. No new orders.  Talked with daughter and she said to go ahead and try the subq. Pt allowed subq injection of robinul.  Late afternoon, pt primo came off. 3 RNs to help clean pt up and apply new primo. Pt very agitated. One RN let him grip her hands while she talked to him. One RN doing clean linens/etc, one doing dirty. Pt compliant with repositioning in bed after he was covered up. He is modest and gets agitated when you have to change his gown or primo.  New orders for scopolamine patch and and morphine. Oral morphine needed changed to morphine dosage that goes in pt cheek d/t to pt not wanting to swallow in afternoon and evening. New orders for morphine.  Lungs still rhonchi after administering robinul, asked MD for atropine drops PRN to help until the patch had time to take effect. New orders for atropine.  Pt clean, dry, resting comfortably at end of shift.  RN from [employer] came to talk with family and assess pt today.

## 2020-12-29 NOTE — Progress Notes (Signed)
PROGRESS NOTE    Gabriel Austin  NUU:[id number] DOB: 10/19/1920 DOA: 12/22/2020 PCP: No primary care provider on file.   Brief Narrative:  Gabriel Austin is a 87 y.o. male with PMH significant for advanced vascular dementia, HTN, CHF, CAD, CKD 3, COPD, GERD, prostate cancer(s/p prostatectomy) and frequent bouts of food impaction due to esophageal stricture with numerous esophageal dilatations in the past presented to hospital with sudden onset of chest discomfort and tightening after taking lunch.  In the ED patient was initially able to speak but progressively became somnolent so emergently underwent EGD with disimpaction.  There was  extensive amount of food in the esophagus which was removed.   The patient was admitted hospital for aspiration event and had subsequent agitation and required chemical and physical restraints. During hospitalization, palliative care was consulted and hospice level of care was discussed.  Patient has been transitioned to comfort care starting 12/28/2020.  Assessment & Plan:  Food impaction due to esophageal stricture: Patient underwent EGD with removal of food impaction and dilation of the esophageal stricture on 12/22/2020.   Not a good candidate for PEG tube feeding due to underlying dementia and agitation.  Currently on comfort care.  Continue full liquids.  Suspected aspiration pneumonia with acute hypoxemic respiratory failure: Currently requiring 3 L of oxygen by nasal cannula.  Received Rocephin and Zithromax during hospitalization for 5 days.  On full liquids as per comfort care  Acute metabolic encephalopathy with underlying Vascular dementia with behavioral symptoms: Patient does have 24-hour private sitter.   On comfort care currently.  AKI on CKD stage IIIb: Received IV fluid hydration during hospitalization.  Essential hypertension./Chronic CHF/Coronary artery disease:  Currently compensated.  GERD without esophagitis: Continue PPI.    COPD: Continue DuoNebs, oxygen supplementation and supportive care.  Chronic thrombocytopenia:  Goals of care: Palliative care on board.  Currently on comfort care.  Recommend discussion with the patient's daughter about comfort care yesterday.  DVT prophylaxis:  None for comfort  Code Status: DNR  Family Communication:  I  spoke with the patient's daughter at length yesterday  Consultants:   GI  Procedures:   Upper GI endoscopy  Antimicrobials:   Azithromycin 4/29> discontinued  Rocephin 4/29> discontinued  Status is: Inpatient  Dispo: The patient is from: ALF              Anticipated d/c is to: Likely to hospice.  Final disposition uncertain so far..  Possible in-hospital death versus residential hospice/home hospice               Patient currently is not medically stable to d/c.   Difficult to place patient No   Subjective: Today, patient was seen and examined at bedside.  Appears to be comfortable.  Objective: Vitals:   12/27/20 0847 12/27/20 1446 12/27/20 2323 12/29/20 0434  BP:  136/67 (!) 149/63 (!) 189/70  Pulse:  61 61 65  Resp:  20 15 17   Temp:  97.9 F (36.6 C) (!) 96.7 F (35.9 C) 98.1 F (36.7 C)  TempSrc:  Oral Axillary Oral  SpO2: 99% 99% 97% 100%    Intake/Output Summary (Last 24 hours) at 12/29/2020 0844 Last data filed at 12/29/2020 0521 Gross per 24 hour  Intake 60 ml  Output 400 ml  Net -340 ml   There were no vitals filed for this visit.  Physical Examination: General:  Average built,  elderly male, comfortable, mildly somnolent , on nasal cannula oxygen HENT:   No scleral pallor or  icterus noted.  Chest: Decreased breath sounds bilaterally.  Bilateral mild rhonchi noted. CVS: S1 &S2 heard. No murmur.  Regular rate and rhythm. Abdomen: Soft, nontender, nondistended.  Bowel sounds are heard.   Extremities: No cyanosis, clubbing or edema.    Index finger  erythematous and swollen. Psych: Somnolent CNS: Moves all  extremities Skin: Warm and dry.    Data Reviewed:  I have personally reviewed the following labs and imaging studies   CBC: Recent Labs  Lab 12/23/20 0309 12/25/20 0627 12/26/20 0618  WBC 9.3 7.7 7.1  NEUTROABS 8.4*  --   --   HGB 10.9* 11.4* 11.4*  HCT 33.7* 35.0* 35.1*  MCV 94.7 95.6 95.4  PLT 122* 134* 131*   Basic Metabolic Panel: Recent Labs  Lab 12/23/20 0309 12/25/20 0627 12/26/20 0618 12/27/20 0254  NA 143 147* 145 144  K 4.0 3.9 3.8 3.6  CL 110 114* 113* 115*  CO2 22 23 21* 17*  GLUCOSE 137* 140* 155* 127*  BUN 38* 42* 39* 43*  CREATININE 1.85* 2.11* 1.80* 1.89*  CALCIUM 8.9 8.9 8.7* 8.5*  MG 2.0  --  2.1 2.1  PHOS  --   --  3.1 3.2   GFR: CrCl cannot be calculated (Unknown ideal weight.). Liver Function Tests: Recent Labs  Lab 12/23/20 0309  AST 19  ALT 26  ALKPHOS 113  BILITOT 0.9  PROT 6.3*  ALBUMIN 3.5   No results for input(s): LIPASE, AMYLASE in the last 168 hours. No results for input(s): AMMONIA in the last 168 hours. Coagulation Profile: No results for input(s): INR, PROTIME in the last 168 hours. Cardiac Enzymes: No results for input(s): CKTOTAL, CKMB, CKMBINDEX, TROPONINI in the last 168 hours. BNP (last 3 results) No results for input(s): PROBNP in the last 8760 hours. HbA1C: No results for input(s): HGBA1C in the last 72 hours. CBG: No results for input(s): GLUCAP in the last 168 hours. Lipid Profile: No results for input(s): CHOL, HDL, LDLCALC, TRIG, CHOLHDL, LDLDIRECT in the last 72 hours. Thyroid Function Tests: No results for input(s): TSH, T4TOTAL, FREET4, T3FREE, THYROIDAB in the last 72 hours. Anemia Panel: No results for input(s): VITAMINB12, FOLATE, FERRITIN, TIBC, IRON, RETICCTPCT in the last 72 hours. Sepsis Labs: Recent Labs  Lab 12/23/20 0309  PROCALCITON 0.13    No results found for this or any previous visit (from the past 240 hour(s)).    Radiology Studies: DG CHEST PORT 1 VIEW  Result Date:  12/27/2020 CLINICAL DATA:  Shortness of breath, history coronary artery disease, vascular dementia, COPD, chronic kidney disease, prostate cancer EXAM: PORTABLE CHEST 1 VIEW COMPARISON:  Portable exam 1512 hours compared to 12/22/2020 FINDINGS: Normal heart size, mediastinal contours, and pulmonary vascularity. Atherosclerotic calcification aorta. Minimal atelectasis RIGHT base. LEFT basilar infiltrate with question additional mild infiltrate in LEFT upper lobe as well. RIGHT apex scarring. No pleural effusion or pneumothorax. Bones demineralized. IMPRESSION: LEFT basilar infiltrate with question of additional mild infiltrate LEFT upper lobe. RIGHT apex scarring and minimal RIGHT basilar atelectasis. Electronically Signed   By: Ulyses Southward M.D.   On: 12/27/2020 16:03    Scheduled Meds: . arformoterol  15 mcg Nebulization BID  . pantoprazole  40 mg Oral Daily  . risperiDONE  0.25 mg Oral BID  . umeclidinium bromide  1 puff Inhalation Daily   Continuous Infusions:    LOS: 6 days   Joycelyn Das, MD [facility] 12/29/2020, 8:44 AM

## 2020-12-29 NOTE — Progress Notes (Signed)
Palliative Medicine Inpatient Follow Up Note  Reason for consult:  " Discuss goals of care"  HPI:  Per intake H&P --> Gabriel Smithis a 87 y.o.malewith PMH significant for advanced vascular dementia,HTN, CHF, CAD, CKD 3, COPD,GERD,prostate cancer(s/pprostatectomy)andfrequent bouts of food impaction due to esophageal stricture with numerous esophageal dilatationsin the past. Patient was brought to the ED on 4/28 with sudden onset of chest discomfort and tightening while taking his lunch concerning for food impaction again.  Palliative care has been asked to get involved in the setting of multiple comorbid conditions and recurrent hospitalizations for food impaction.  It has been requested that we discuss goals of care with the patient's loved ones.  Today's Discussion (12/29/2020):  Chart reviewed. Assessed patient at the bedside and met with his son Gabriel Austin, daughter Gabriel Austin, and son-in-law Gabriel Austin. Patient is resting, excessive secretions are heard, and he has occasional jerking motions.     Reviewed interval history since our last discussion 2 days ago. Gabriel Austin states that there has been a miscommunication, as he wanted to hear from residential hospice facilities in addition to the options for nursing homes. We discussed the various locations in [place], [place] then made plans to share the family's interest with [facility] team today. Gabriel Austin also shares that while he is "no expert" and does not have an instinct feeling of how long his father has to live, he would not be surprised if Gabriel Austin died in the hospital within the coming days. Gabriel Austin speaks to their initial feelings of denial and now current understanding of the unfortunate prognosis as well. Family continues to voice preference for focusing on patient's comfort and quality interactions during their remaining time together.   Gabriel Austin notes that since removal of IV and Lena, patient has slept more peacefully and no longer  groans through the night. He continues to be very sensitive to touch, however, and family is concerned for possible untreated pain. Thus, oral suction has been limited by patient's agitation and excessive secretions have not improved with PRN Robinul.   Counseled on risks and benefits of scheduled Robinul. Educated on evidence for diminished perception of dehydration at EOL, restlessness in the EOL trajectory and other expectations. Counseled on risks and benefits of scheduled Morphine, such as increased sedation. Discussed recent changes to Risperdal for agitated behaviors, including Dr. Leona Carry recommendation of increasing frequency to 3 times daily, and family's agreement with this. Discussed risks and benefits of increasing Risperdal frequency. Family has decided to proceed with scheduled Robinul and Morphine as well.      Questions and concerns addressed. PMT will continue to support holistically.   Objective Assessment: Vital Signs Vitals:   12/27/20 2323 12/29/20 0434  BP: (!) 149/63 (!) 189/70  Pulse: 61 65  Resp: 15 17  Temp: (!) 96.7 F (35.9 C) 98.1 F (36.7 C)  SpO2: 97% 100%    Intake/Output Summary (Last 24 hours) at 12/29/2020 1246 Last data filed at 12/29/2020 0830 Gross per 24 hour  Intake 60 ml  Output 400 ml  Net -340 ml     SUMMARY OF RECOMMENDATIONS -Will increase Risperdal frequency to TID at family's request -Scheduled Robinul PO or SubQ for excessive secretions (pt has increased agitation with oral suction)  -Scopolamine patch order signed and held for excessive secretions - RN to release if patient misses more than 2 doses/family declines SubQ Robinul repeatedly -Scheduled SL Morphine for pain, dyspnea, air hunger -Discussed residential hospice options at length and coordinated with TOC for additional  conversations with family to further delineate preferences  -Educated on EOL concepts, expectations, trajectory -Palliative care will continue to follow along  and aid in ongoing goals of care conversations   Time Spent: 55 minutes Greater than 50% of this time was spent in counseling and coordinating of care ______________________________________________________________________________________ [facility] Team Team Cell Phone: [phone] Please utilize secure chat with additional questions, if there is no response within 30 minutes please call the above phone number  Palliative Medicine Team providers are available by phone from 7am to 7pm daily and can be reached through the team cell phone.  Should this patient require assistance outside of these hours, please call the patient's attending physician.

## 2020-12-29 NOTE — TOC Progression Note (Addendum)
Transition of Care [facility]) - Progression Note    Patient Details  Name: Gabriel Austin MRN: [id number] Date of Birth: [birth year]  Transition of Care [facility]) CM/SW Contact  Jimmy Picket, [place] Phone Number: 12/29/2020, 1:38 PM  Clinical Narrative:     PTs children have decided to pursue hospice placement. CSW sent referral to: [facility], [facility], and [facility] in [place] per families request.   Janeece Agee in [place] is requesting CSW fax clinicals to [phone]. They can be reached by phone at [phone].   Expected Discharge Plan: Memory Care Barriers to Discharge: No Barriers Identified  Expected Discharge Plan and Services Expected Discharge Plan: Memory Care       Living arrangements for the past 2 months: Assisted Living Facility                                       Social Determinants of Health (SDOH) Interventions    Readmission Risk Interventions No flowsheet data found.  Jimmy Picket, Theresia Majors, [place] Clinical Social Worker [phone]

## 2020-12-30 DIAGNOSIS — Z515 Encounter for palliative care: Secondary | ICD-10-CM | POA: Diagnosis not present

## 2020-12-30 DIAGNOSIS — K222 Esophageal obstruction: Secondary | ICD-10-CM | POA: Diagnosis not present

## 2020-12-30 DIAGNOSIS — R0602 Shortness of breath: Secondary | ICD-10-CM | POA: Diagnosis not present

## 2020-12-30 DIAGNOSIS — I509 Heart failure, unspecified: Secondary | ICD-10-CM | POA: Diagnosis not present

## 2020-12-30 DIAGNOSIS — J449 Chronic obstructive pulmonary disease, unspecified: Secondary | ICD-10-CM | POA: Diagnosis not present

## 2020-12-30 DIAGNOSIS — N183 Chronic kidney disease, stage 3 unspecified: Secondary | ICD-10-CM | POA: Diagnosis not present

## 2020-12-30 MED ORDER — MORPHINE SULFATE (CONCENTRATE) 10 MG/0.5ML PO SOLN: 5.0000 mg | ORAL | Status: AC

## 2020-12-30 MED ORDER — ATROPINE SULFATE 1 % OP SOLN: 3.0000 [drp] | OPHTHALMIC | Status: AC | PRN

## 2020-12-30 MED ORDER — GLYCOPYRROLATE 1 MG PO TABS: 1.0000 mg | ORAL_TABLET | ORAL | Status: AC

## 2020-12-30 MED ORDER — RISPERIDONE 0.25 MG PO TBDP: 0.2500 mg | ORAL_TABLET | Freq: Three times a day (TID) | ORAL | Status: AC

## 2020-12-30 MED ORDER — ACETAMINOPHEN 650 MG RE SUPP
650.0000 mg | Freq: Three times a day (TID) | RECTAL | 0 refills | Status: AC | PRN
Start: 2020-12-30 — End: ?

## 2020-12-30 MED ORDER — IPRATROPIUM-ALBUTEROL 0.5-2.5 (3) MG/3ML IN SOLN: 3.0000 mL | RESPIRATORY_TRACT | Status: AC | PRN

## 2020-12-30 MED ORDER — HALOPERIDOL 0.5 MG PO TABS
0.5000 mg | ORAL_TABLET | ORAL | Status: AC | PRN
Start: 2020-12-30 — End: ?

## 2020-12-30 MED ORDER — SCOPOLAMINE 1 MG/3DAYS TD PT72: 1.0000 | MEDICATED_PATCH | TRANSDERMAL | Status: AC

## 2020-12-30 NOTE — Discharge Summary (Signed)
Physician Discharge Summary  Gabriel Austin XBW:[id number] DOB: 1921/04/17 DOA: 12/22/2020  PCP: No primary care provider on file.  Admit date: 12/22/2020 Discharge date: 12/30/2020  Admitted From: Home  Discharge disposition: Residential hospice  Recommendations for Outpatient Follow-Up:   Follow-up with hospice care provider at the facility.  Discharge Diagnosis:   Principal Problem:   Esophageal obstruction due to food impaction Active Problems:   CKD (chronic kidney disease), stage III (HCC)   Vascular dementia with behavioral disturbance (HCC)   Suspected pulmonary aspiration of food   Coronary artery disease involving native coronary artery of native heart without angina pectoris   Mixed hyperlipidemia   GERD without esophagitis   COPD (chronic obstructive pulmonary disease) (HCC)   Chronic congestive heart failure (HCC)   Food bolus obstruction of intestine (HCC)   SOB (shortness of breath)   Goals of care, counseling/discussion   Palliative care by specialist  Discharge Condition: Stable  Diet recommendation: Full liquids, crushed meds  Wound care: None.  Code status: DNR   History of Present Illness:   Gabriel Smithis a 87 y.o.malewith past medical history  significant for advanced vascular dementia,HTN, CHF, CAD, CKD 3, COPD,GERD,prostate cancer(s/pprostatectomy)andfrequent bouts of food impaction due to esophageal stricture with numerous esophageal dilatationsin the past presented to hospital with sudden onset of chest discomfort and tightening after taking lunch.  In the ED, patient was initially able to speak but progressively became somnolent so emergently underwent EGD with disimpaction.  There was  extensive amount of food in the esophagus which was removed.  The patient was admitted hospital for aspiration event and had subsequent agitation and required chemical and physical restraints. During hospitalization, palliative care was consulted and  hospice level of care was discussed.  Patient has been transitioned to comfort care starting 12/28/2020.    Hospital Course:   Following conditions were addressed during hospitalization as listed below,  Food impaction due to esophageal stricture: Patient underwent EGD with removal of food impaction and dilation of the esophageal stricture on 12/22/2020.   Not a good candidate for PEG tube feeding due to underlying dementia and agitation.  Currently on comfort care.  Continue full liquids.  Awaiting for residential hospice  Suspected aspiration pneumonia with acute hypoxemic respiratory failure: Currently requiring 3 L of oxygen by nasal cannula.  Received Rocephin and Zithromax during hospitalization for 5 days.  On full liquids as per comfort care.  Acute metabolic encephalopathy with underlying Vascular dementia with behavioral symptoms: Patient does have 24-hour Engineer, site.   On comfort care currently. Stable  for residential hospice  AKI on CKD stage IIIb: Received IV fluid hydration during hospitalization.  Essential hypertension./Chronic CHF/Coronary artery disease:  Currently compensated.  GERD without esophagitis: Continue PPI.   COPD: Continue DuoNebs, oxygen supplementation and supportive care.  Chronic thrombocytopenia  Goals of care: Palliative care on board.  Currently on comfort care. Awaiting for residential hospice  Disposition.  At this time, patient is stable for disposition to residential hospice facility.  Spoke with the patient's daughter at bedside.  Medical Consultants:    GI  Palliative care  Procedures:     Upper GI endoscopy  Subjective:   Today, patient was seen and examined at bedside.  Appears to be a little more comfortable this morning but had little rough night  Discharge Exam:   Vitals:   12/30/20 0537 12/30/20 0856  BP: (!) 161/76   Pulse: (!) 59 60  Resp: 16 16  Temp: 98 F (36.7 C)  SpO2: 96%    Vitals:    12/27/20 2323 12/29/20 0434 12/30/20 0537 12/30/20 0856  BP: (!) 149/63 (!) 189/70 (!) 161/76   Pulse: 61 65 (!) 59 60  Resp: 15 17 16 16   Temp: (!) 96.7 F (35.9 C) 98.1 F (36.7 C) 98 F (36.7 C)   TempSrc: Axillary Oral Oral   SpO2: 97% 100% 96%     General:  Average built, lethargic, intermittent twitches, HENT:   No scleral pallor or icterus noted.  Chest:  Clear breath sounds.  Diminished breath sounds bilaterally.  CVS: S1 &S2 heard. No murmur.  Regular rate and rhythm. Abdomen: Soft, nontender, nondistended.  Bowel sounds are heard.   Extremities: No cyanosis, clubbing or edema.  Psych: Somnolent, intermittent muscle twitches, CNS: Moves extremities, somnolent, Skin: Warm and dry.  No rashes noted.  The results of significant diagnostics from this hospitalization (including imaging, microbiology, ancillary and laboratory) are listed below for reference.     Diagnostic Studies:   DG Chest 1 View  Result Date: 12/22/2020 CLINICAL DATA:  Aspiration into airway. EXAM: CHEST  1 VIEW COMPARISON:  Radiograph earlier today. FINDINGS: Left costophrenic angle is not included in the field of view. Patchy left basilar airspace disease is similar to earlier today. There is new ill-defined right perihilar opacity. Peripheral reticulation at the right costophrenic angle appears chronic. Stable heart size and mediastinal contours. Aortic atherosclerosis. No pneumothorax. No evidence of pneumomediastinum. IMPRESSION: 1. New ill-defined right perihilar opacity, may be atelectasis or aspiration. 2. Unchanged patchy left basilar airspace disease from earlier today, may be atelectasis or pneumonia. 3. Left costophrenic angle excluded from the field of view. Patient had difficulty tolerating the exam. Electronically Signed   By: Narda RutherfordMelanie  Sanford M.D.   On: 12/22/2020 23:11   DG Chest Portable 1 View  Result Date: 12/22/2020 CLINICAL DATA:  Food impaction EXAM: PORTABLE CHEST 1 VIEW COMPARISON:   None. FINDINGS: The heart is enlarged. Vascular calcifications are seen in the aortic arch. There is mild left and minimal right basilar atelectasis/airspace disease. A small left pleural effusion may contribute. There is no right pleural effusion. There is no pneumothorax. Biapical pleuroparenchymal scarring is noted. Degenerative changes are seen in the spine. IMPRESSION: Mild left and minimal right basilar atelectasis/airspace disease. A small left pleural effusion may contribute. Electronically Signed   By: Romona Curlsyler  Litton M.D.   On: 12/22/2020 18:57     Labs:   Basic Metabolic Panel: Recent Labs  Lab 12/25/20 0627 12/26/20 0618 12/27/20 0254  NA 147* 145 144  K 3.9 3.8 3.6  CL 114* 113* 115*  CO2 23 21* 17*  GLUCOSE 140* 155* 127*  BUN 42* 39* 43*  CREATININE 2.11* 1.80* 1.89*  CALCIUM 8.9 8.7* 8.5*  MG  --  2.1 2.1  PHOS  --  3.1 3.2   GFR CrCl cannot be calculated (Unknown ideal weight.). Liver Function Tests: No results for input(s): AST, ALT, ALKPHOS, BILITOT, PROT, ALBUMIN in the last 168 hours. No results for input(s): LIPASE, AMYLASE in the last 168 hours. No results for input(s): AMMONIA in the last 168 hours. Coagulation profile No results for input(s): INR, PROTIME in the last 168 hours.  CBC: Recent Labs  Lab 12/25/20 0627 12/26/20 0618  WBC 7.7 7.1  HGB 11.4* 11.4*  HCT 35.0* 35.1*  MCV 95.6 95.4  PLT 134* 131*   Cardiac Enzymes: No results for input(s): CKTOTAL, CKMB, CKMBINDEX, TROPONINI in the last 168 hours. BNP: Invalid input(s): POCBNP CBG:  No results for input(s): GLUCAP in the last 168 hours. D-Dimer No results for input(s): DDIMER in the last 72 hours. Hgb A1c No results for input(s): HGBA1C in the last 72 hours. Lipid Profile No results for input(s): CHOL, HDL, LDLCALC, TRIG, CHOLHDL, LDLDIRECT in the last 72 hours. Thyroid function studies No results for input(s): TSH, T4TOTAL, T3FREE, THYROIDAB in the last 72 hours.  Invalid  input(s): FREET3 Anemia work up No results for input(s): VITAMINB12, FOLATE, FERRITIN, TIBC, IRON, RETICCTPCT in the last 72 hours. Microbiology No results found for this or any previous visit (from the past 240 hour(s)).   Discharge Instructions:   Discharge Instructions    Diet full liquid   Complete by: As directed    Discharge instructions   Complete by: As directed    Follow-up as per hospice care provider.   Increase activity slowly   Complete by: As directed    No wound care   Complete by: As directed      Allergies as of 12/30/2020      Reactions   Beta Adrenergic Blockers Other (See Comments)   Lowers hr   Codeine Nausea And Vomiting   Reopro [abciximab] Other (See Comments)   Lower platelet   Pulmicort [budesonide] Rash      Medication List    STOP taking these medications   Acetaminophen Extra Strength 500 MG tablet Generic drug: acetaminophen Replaced by: acetaminophen 650 MG suppository   aspirin EC 81 MG tablet   atorvastatin 40 MG tablet Commonly known as: LIPITOR   furosemide 40 MG tablet Commonly known as: LASIX   irbesartan 150 MG tablet Commonly known as: AVAPRO   multivitamin capsule   PreserVision AREDS 2+Multi Vit Caps   risperiDONE 0.25 MG tablet Commonly known as: RISPERDAL Replaced by: Risperidone 0.25 MG Tbdp   Zoloft 25 MG tablet Generic drug: sertraline     TAKE these medications   acetaminophen 650 MG suppository Commonly known as: TYLENOL Place 1 suppository (650 mg total) rectally every 8 (eight) hours as needed for fever. Replaces: Acetaminophen Extra Strength 500 MG tablet   arformoterol 15 MCG/2ML Nebu Commonly known as: BROVANA Take 15 mcg by nebulization 2 (two) times daily.   atropine 1 % ophthalmic solution Place 3 drops under the tongue every 4 (four) hours as needed (excess secretions).   dextromethorphan-guaiFENesin 10-100 MG/5ML liquid Commonly known as: ROBITUSSIN-DM Take 5 mLs by mouth 2 (two) times  daily.   fluticasone 50 MCG/ACT nasal spray Commonly known as: FLONASE Place 1 spray into both nostrils daily.   glycopyrrolate 1 MG tablet Commonly known as: ROBINUL Take 1 tablet (1 mg total) by mouth every 4 (four) hours.   haloperidol 0.5 MG tablet Commonly known as: [name] Take 1 tablet (0.5 mg total) by mouth every 4 (four) hours as needed for agitation (or delirium).   ipratropium-albuterol 0.5-2.5 (3) MG/3ML Soln Commonly known as: DUONEB Take 3 mLs by nebulization every 4 (four) hours as needed.   LORazepam 0.5 MG tablet Commonly known as: ATIVAN Take 0.25 mg by mouth every 6 (six) hours as needed for anxiety. What changed: Another medication with the same name was removed. Continue taking this medication, and follow the directions you see here.   morphine CONCENTRATE 10 MG/0.5ML Soln concentrated solution Place 0.25 mLs (5 mg total) under the tongue every 4 (four) hours.   nystatin powder Commonly known as: MYCOSTATIN/NYSTOP Apply 1 application topically in the morning and at bedtime. Sprinkle on feet/toes   omeprazole 20  MG capsule Commonly known as: PRILOSEC Take 20 mg by mouth daily.   ProAir HFA 108 (90 Base) MCG/ACT inhaler Generic drug: albuterol Inhale 2 puffs into the lungs every 8 (eight) hours as needed for wheezing or shortness of breath.   Risperidone 0.25 MG Tbdp Take 1 tablet (0.25 mg total) by mouth 3 (three) times daily. Replaces: risperiDONE 0.25 MG tablet   scopolamine 1 MG/3DAYS Commonly known as: TRANSDERM-SCOP Place 1 patch (1.5 mg total) onto the skin every 3 (three) days. Start taking on: Jan 01, 2021   tiotropium 18 MCG inhalation capsule Commonly known as: SPIRIVA Place 18 mcg into inhaler and inhale daily.         Time coordinating discharge: 39 minutes  Signed:  Tam Delisle  [facility] 12/30/2020, 2:33 PM

## 2020-12-30 NOTE — Plan of Care (Signed)
  Problem: Safety: Goal: Non-violent Restraint(s) 12/30/2020 1526 by Elon Jester, RN Outcome: Not Applicable 02/27/813 4818 by Elon Jester, RN Outcome: Not Met (add Reason)

## 2020-12-30 NOTE — Progress Notes (Signed)
Civil engineer, contracting [facility])   Received request from [facility] for family interest in [facility]. [facility] is unable to offer a room today. Hospital Liaison will follow up tomorrow or sooner if a room becomes available. Spoke to daughter Elita Quick who would like to keep [facility] on the list for bed placement.   Please do not hesitate to call with questions.   Thank you,  Yolande Jolly, BSN, [facility]  [phone]

## 2020-12-30 NOTE — Progress Notes (Signed)
PROGRESS NOTE    Gabriel Austin  OHY:[id number] DOB: 06/27/1921 DOA: 12/22/2020 PCP: No primary care provider on file.   Brief Narrative:   Gabriel Austin is a 87 y.o. male with past medical history  significant for advanced vascular dementia, HTN, CHF, CAD, CKD 3, COPD, GERD, prostate cancer(s/p prostatectomy) and frequent bouts of food impaction due to esophageal stricture with numerous esophageal dilatations in the past presented to hospital with sudden onset of chest discomfort and tightening after taking lunch.  In the ED, patient was initially able to speak but progressively became somnolent so emergently underwent EGD with disimpaction.  There was  extensive amount of food in the esophagus which was removed.   The patient was admitted hospital for aspiration event and had subsequent agitation and required chemical and physical restraints.  During hospitalization, palliative care was consulted and hospice level of care was discussed.  Patient has been transitioned to comfort care starting 12/28/2020.  Currently awaiting for inpatient hospice placement.  Assessment & Plan:  Food impaction due to esophageal stricture: Patient underwent EGD with removal of food impaction and dilation of the esophageal stricture on 12/22/2020.   Not a good candidate for PEG tube feeding due to underlying dementia and agitation.  Currently on comfort care.  Continue full liquids.  Awaiting for residential hospice  Suspected aspiration pneumonia with acute hypoxemic respiratory failure: Currently requiring 3 L of oxygen by nasal cannula.  Received Rocephin and Zithromax during hospitalization for 5 days.  On full liquids as per comfort care.  Acute metabolic encephalopathy with underlying Vascular dementia with behavioral symptoms: Patient does have 24-hour Engineer, site.   On comfort care currently. Awaiting for residential hospice  AKI on CKD stage IIIb: Received IV fluid hydration during  hospitalization.  Essential hypertension./Chronic CHF/Coronary artery disease:  Currently compensated.  GERD without esophagitis: Continue PPI.   COPD: Continue DuoNebs, oxygen supplementation and supportive care.  Chronic thrombocytopenia:  Goals of care: Palliative care on board.  Currently on comfort care. Awaiting for residential hospice  DVT prophylaxis:  None for comfort  Code Status: DNR  Family Communication:  I again spoke with the patient's daughter at length at bedside.  Consultants:   GI  Palliative care  Procedures:   Upper GI endoscopy  Antimicrobials:   Azithromycin 4/29> discontinued  Rocephin 4/29> discontinued  Status is: Inpatient  Dispo: The patient is from: ALF              Anticipated d/c is to: Residential hospice when bed is available              Patient currently is  medically stable to d/c when bed available.   Difficult to place patient No    Subjective: Today, patient was seen and examined at bedside.  Appears to be a little more comfortable this morning but had little rough night.   Objective: Vitals:   12/27/20 2323 12/29/20 0434 12/30/20 0537 12/30/20 0856  BP: (!) 149/63 (!) 189/70 (!) 161/76   Pulse: 61 65 (!) 59 60  Resp: 15 17 16 16   Temp: (!) 96.7 F (35.9 C) 98.1 F (36.7 C) 98 F (36.7 C)   TempSrc: Axillary Oral Oral   SpO2: 97% 100% 96%     Intake/Output Summary (Last 24 hours) at 12/30/2020 1348 Last data filed at 12/30/2020 1300 Gross per 24 hour  Intake 120 ml  Output 450 ml  Net -330 ml   There were no vitals filed for this visit.  Physical  Examination: General:  Average built, lethargic, intermittent twitches, HENT:   No scleral pallor or icterus noted.  Chest:  Clear breath sounds.  Diminished breath sounds bilaterally.  CVS: S1 &S2 heard. No murmur.  Regular rate and rhythm. Abdomen: Soft, nontender, nondistended.  Bowel sounds are heard.   Extremities: No cyanosis, clubbing or edema.  Psych:  Somnolent, intermittent muscle twitches, CNS: Moves extremities, somnolent, Skin: Warm and dry.  No rashes noted.  Data Reviewed:  I have personally reviewed the following labs and imaging studies   CBC: Recent Labs  Lab 12/25/20 0627 12/26/20 0618  WBC 7.7 7.1  HGB 11.4* 11.4*  HCT 35.0* 35.1*  MCV 95.6 95.4  PLT 134* 131*   Basic Metabolic Panel: Recent Labs  Lab 12/25/20 0627 12/26/20 0618 12/27/20 0254  NA 147* 145 144  K 3.9 3.8 3.6  CL 114* 113* 115*  CO2 23 21* 17*  GLUCOSE 140* 155* 127*  BUN 42* 39* 43*  CREATININE 2.11* 1.80* 1.89*  CALCIUM 8.9 8.7* 8.5*  MG  --  2.1 2.1  PHOS  --  3.1 3.2   GFR: CrCl cannot be calculated (Unknown ideal weight.). Liver Function Tests: No results for input(s): AST, ALT, ALKPHOS, BILITOT, PROT, ALBUMIN in the last 168 hours. No results for input(s): LIPASE, AMYLASE in the last 168 hours. No results for input(s): AMMONIA in the last 168 hours. Coagulation Profile: No results for input(s): INR, PROTIME in the last 168 hours. Cardiac Enzymes: No results for input(s): CKTOTAL, CKMB, CKMBINDEX, TROPONINI in the last 168 hours. BNP (last 3 results) No results for input(s): PROBNP in the last 8760 hours. HbA1C: No results for input(s): HGBA1C in the last 72 hours. CBG: No results for input(s): GLUCAP in the last 168 hours. Lipid Profile: No results for input(s): CHOL, HDL, LDLCALC, TRIG, CHOLHDL, LDLDIRECT in the last 72 hours. Thyroid Function Tests: No results for input(s): TSH, T4TOTAL, FREET4, T3FREE, THYROIDAB in the last 72 hours. Anemia Panel: No results for input(s): VITAMINB12, FOLATE, FERRITIN, TIBC, IRON, RETICCTPCT in the last 72 hours. Sepsis Labs: No results for input(s): PROCALCITON, LATICACIDVEN in the last 168 hours.  No results found for this or any previous visit (from the past 240 hour(s)).    Radiology Studies: No results found.  Scheduled Meds: . arformoterol  15 mcg Nebulization BID  .  glycopyrrolate  0.4 mg Subcutaneous Q4H   Or  . glycopyrrolate  1 mg Oral Q4H  . morphine CONCENTRATE  5 mg Sublingual Q4H  . pantoprazole  40 mg Oral Daily  . risperiDONE  0.25 mg Oral TID  . scopolamine  1 patch Transdermal Q72H  . umeclidinium bromide  1 puff Inhalation Daily   Continuous Infusions:    LOS: 7 days   Joycelyn Das, MD [facility] 12/30/2020, 1:48 PM

## 2020-12-30 NOTE — Plan of Care (Signed)
Goals not met with Pt due to advance Dementia.  Pt going to [place], report given to [employer].

## 2020-12-30 NOTE — Progress Notes (Signed)
   Referral received from [facility].  TC to the daughter to set up a time that we can discuss hospice services at the Hospice home in [facility]. Was unsuccessful last evening and this am with contacting the daughter. Will await telephone call back. Norm Parcel RN [phone]

## 2020-12-30 NOTE — Progress Notes (Signed)
Wasted 0.3 mL of morphine (10 mg/0.5 mL) in 6NA pyxis room in the [place] container. Kirtland Bouchard, witnessed waste.  Medication was sent up from pharmacy STAT d/t both of our pyxis machines being out, so I was not able to document wasting in the Pyxis.

## 2020-12-30 NOTE — Progress Notes (Signed)
   Palliative Medicine Inpatient Follow Up Note  Reason for consult:  " Discuss goals of care"  HPI:  Per intake H&P --> Gabriel Smithis a 87 y.o.malewith PMH significant for advanced vascular dementia,HTN, CHF, CAD, CKD 3, COPD,GERD,prostate cancer(s/pprostatectomy)andfrequent bouts of food impaction due to esophageal stricture with numerous esophageal dilatationsin the past. Patient was brought to the ED on 4/28 with sudden onset of chest discomfort and tightening while taking his lunch concerning for food impaction again.  Palliative care has been asked to get involved in the setting of multiple comorbid conditions and recurrent hospitalizations for food impaction.  It has been requested that we discuss goals of care with the patient's loved ones.  Today's Discussion (12/30/2020):  Chart reviewed. Returned a call from patient's son Gabriel Austin requesting updates on the residential hospice referral process. After an in-person visit yesterday, he is leaning towards [facility] in [place] and informs me that patient's information has been received by facility, currently under review. Provided update on the same status for patient's [facility] referral. Provided contact information for [facility], as she has been unable to reach patient's daughter. Questions and concerns addressed.    I then assessed patient at the bedside and met with his daughter Gabriel Austin and son-in-law Gabriel Austin. Patient is resting with occasional jerking of the upper extremities. Markedly improved secretions. Gabriel Austin reports the patient had a rough first half of the evening, which coughing and secretions persisting. This gradually improved by 4am. She believes morphine has been helpful as well. We reviewed potential side effects of scopolamine patch and atropine drops. Discussed the option to increase frequency of morphine if patient continues to have restlessness and sensitivity to  touch between doses. Provided an update on previous conversation with Gabriel Austin this morning.  Questions and concerns addressed. PMT will continue to support holistically.   Objective Assessment: Vital Signs Vitals:   12/30/20 0537 12/30/20 0856  BP: (!) 161/76   Pulse: (!) 59 60  Resp: 16 16  Temp: 98 F (36.7 C)   SpO2: 96%     Intake/Output Summary (Last 24 hours) at 12/30/2020 1022 Last data filed at 12/30/2020 0951 Gross per 24 hour  Intake 120 ml  Output 450 ml  Net -330 ml     SUMMARY OF RECOMMENDATIONS -Educated on symptom management for comfort, continue with current plan of care -Family is leaning towards [facility] residential hospice, awaiting review from various facilities  -Palliative care will continue to follow along and aid in ongoing goals of care conversations   Time Spent: 15 minutes Greater than 50% of this time was spent in counseling and coordinating care related to the above assessment and plan.  Dorthy Cooler, PA-C Palliative Medicine Team Team phone # [phone]  Thank you for allowing the Palliative Medicine Team to assist in the care of this patient. Please utilize secure chat with additional questions, if there is no response within 30 minutes please call the above phone number.  Palliative Medicine Team providers are available by phone from 7am to 7pm daily and can be reached through the team cell phone.  Should this patient require assistance outside of these hours, please call the patient's attending physician.

## 2020-12-30 NOTE — Care Management (Addendum)
Received a call from [facility] . Gabriel Austin has accepted a bed and they can admit  Today.  NCM confirmed with Gabriel Austin at bedside she has accepted a bed at [facility] in [place].   1430 Gabriel Austin has received consents completed from [facility] and [name] is ready ready to admit patient.    Number to call report sent to nurse in secure chat. [facility] called estimated pick up in one hour. Family and nurse aware.  Gabriel Flurry RN

## 2020-12-30 NOTE — TOC Progression Note (Signed)
Transition of Care [facility]) - Progression Note    Patient Details  Name: Gabriel Austin MRN: [id number] Date of Birth: 07-Feb-1921  Transition of Care [facility]) CM/SW Contact  Jimmy Picket, [place] Phone Number: 12/30/2020, 12:39 PM  Clinical Narrative:     CSW received a voice message fro [facility] stating that pt did not meet criteria for admission. NCM informed family who stated they wanted to her from [facility] directly. CSW returned call to [facility] and informed them of familys request to call. CSW also faxed them updated clinicals to review.   Expected Discharge Plan: Memory Care Barriers to Discharge: No Barriers Identified  Expected Discharge Plan and Services Expected Discharge Plan: Memory Care       Living arrangements for the past 2 months: Assisted Living Facility                                       Social Determinants of Health (SDOH) Interventions    Readmission Risk Interventions No flowsheet data found.  Jimmy Picket, Theresia Majors, [place] Clinical Social Worker [phone]

## 2021-01-25 DEATH — deceased

## 2021-06-02 IMAGING — DX DG CHEST 1V
2 series · 2 of 2 positions shown · non-contrast
Comparison: Radiograph earlier today.

CLINICAL DATA: Aspiration into airway.

EXAM:
CHEST  1 VIEW

[chest ap (1 of 2)]
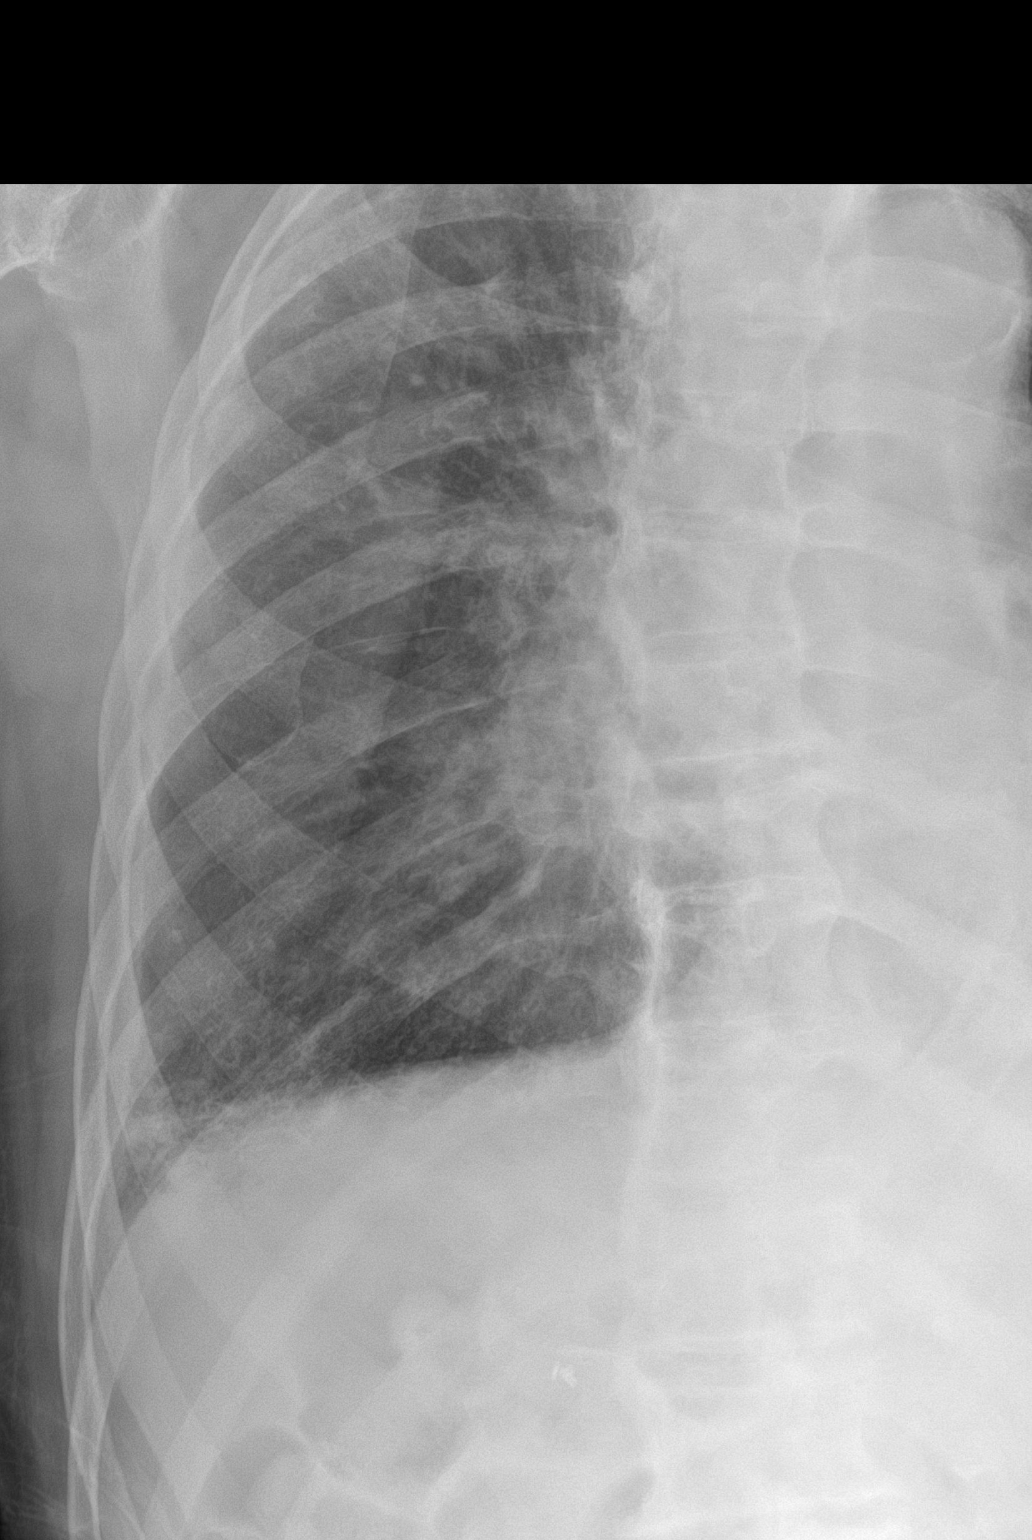

[chest ap (2 of 2)]
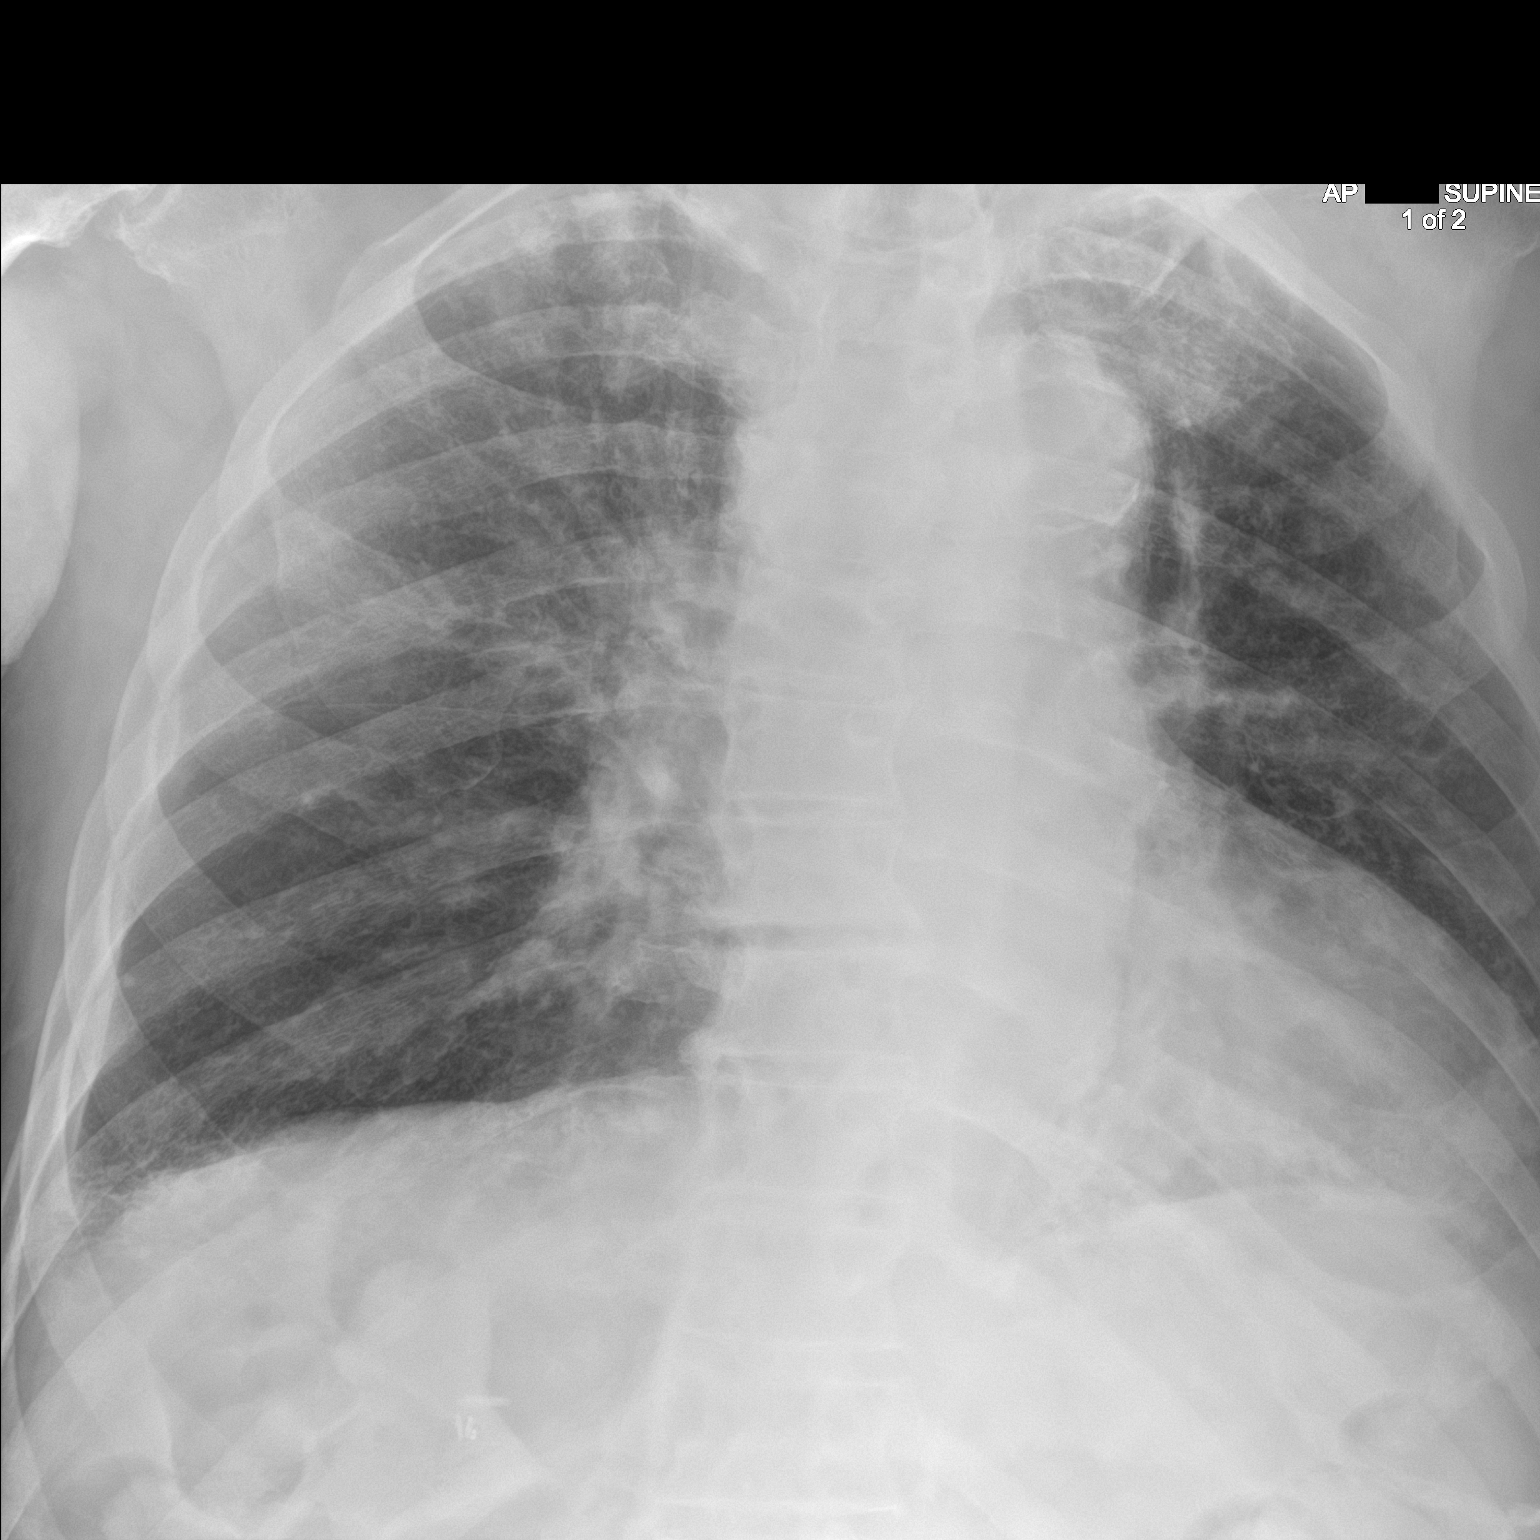

[2 of 2 positions shown; findings below may reference images not displayed]

FINDINGS: Left costophrenic angle is not included in the field of view. Patchy
left basilar airspace disease is similar to earlier today. There is
new ill-defined right perihilar opacity. Peripheral reticulation at
the right costophrenic angle appears chronic. Stable heart size and
mediastinal contours. Aortic atherosclerosis. No pneumothorax. No
evidence of pneumomediastinum.
IMPRESSION: 1. New ill-defined right perihilar opacity, may be atelectasis or
aspiration.
2. Unchanged patchy left basilar airspace disease from earlier
today, may be atelectasis or pneumonia.
3. Left costophrenic angle excluded from the field of view. Patient
had difficulty tolerating the exam.

## 2021-06-02 IMAGING — DX DG CHEST 1V PORT
1 series · 1 of 1 positions shown · non-contrast
Comparison: None.

CLINICAL DATA: Food impaction

EXAM:
PORTABLE CHEST 1 VIEW

[chest]
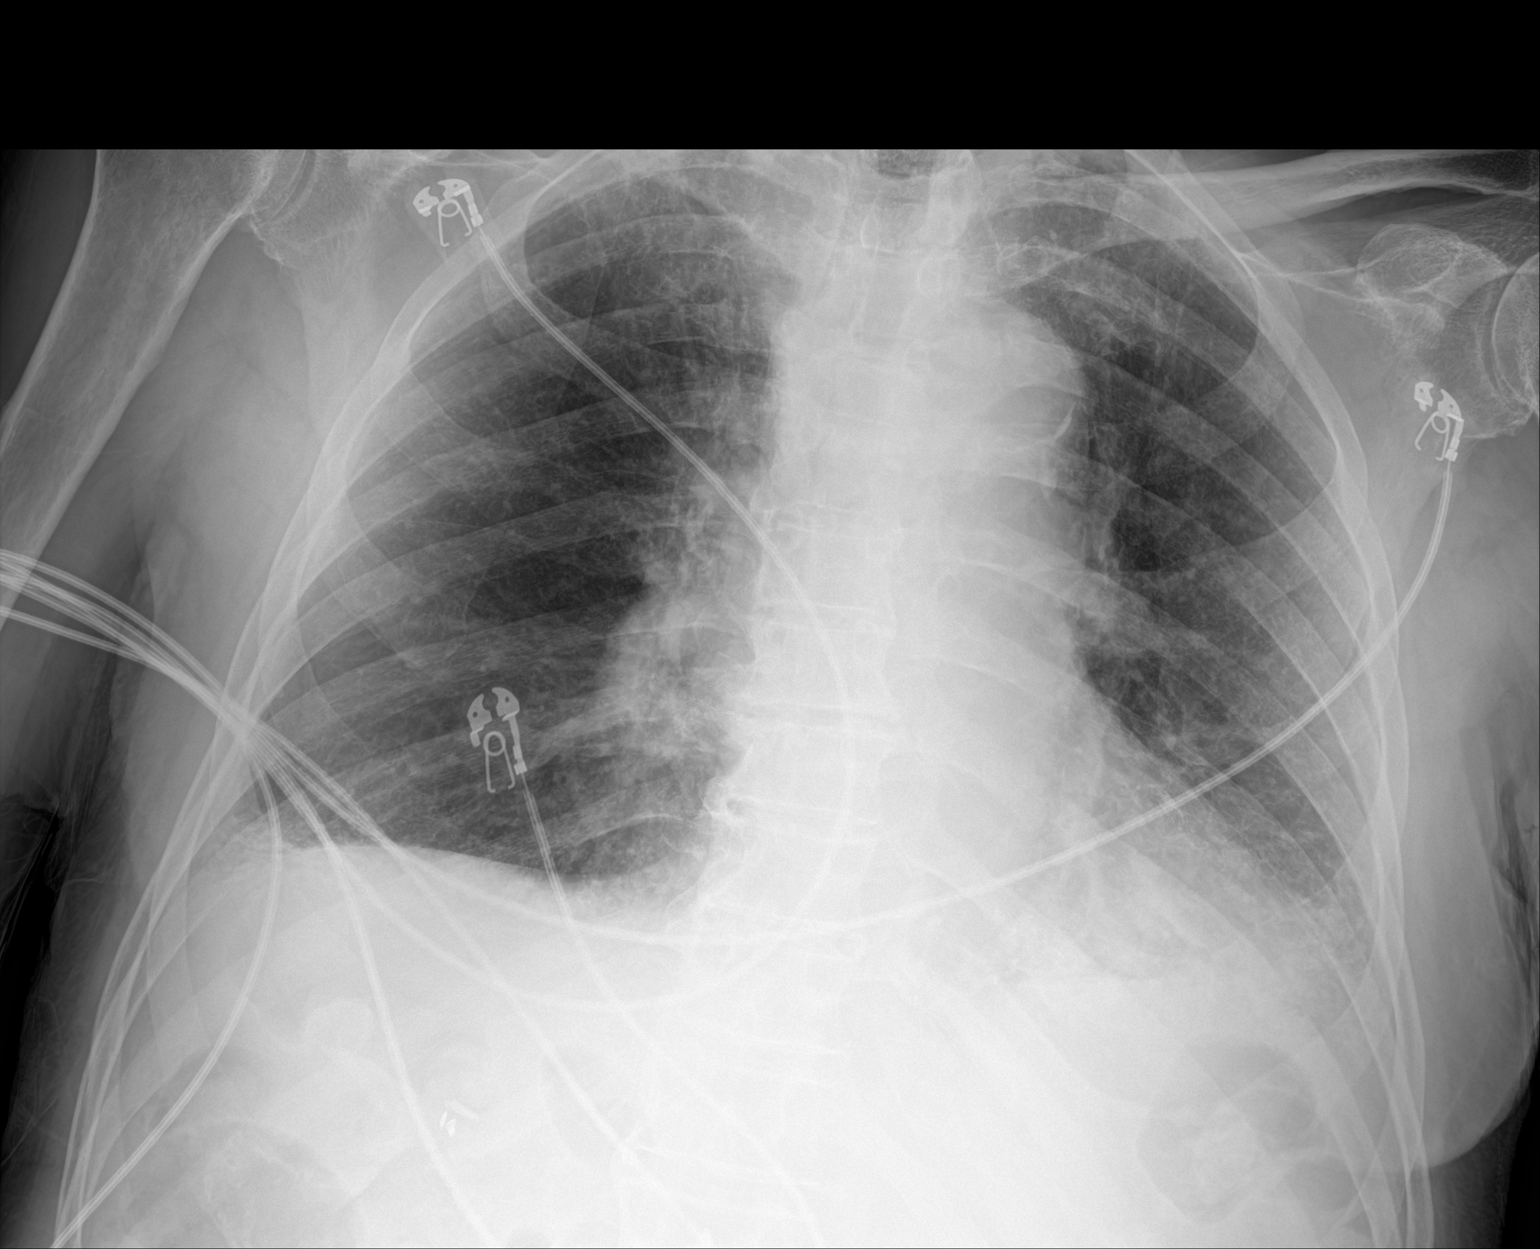

[1 of 1 positions shown; findings below may reference images not displayed]

FINDINGS: The heart is enlarged. Vascular calcifications are seen in the
aortic arch. There is mild left and minimal right basilar
atelectasis/airspace disease. A small left pleural effusion may
contribute. There is no right pleural effusion. There is no
pneumothorax. Biapical pleuroparenchymal scarring is noted.
Degenerative changes are seen in the spine.
IMPRESSION: Mild left and minimal right basilar atelectasis/airspace disease. A
small left pleural effusion may contribute.

## 2021-06-07 IMAGING — DX DG CHEST 1V PORT
1 series · 1 of 1 positions shown · non-contrast
Comparison: Portable exam 6669 hours compared to 12/22/2020

CLINICAL DATA: Shortness of breath, history coronary artery
disease, vascular dementia, COPD, chronic kidney disease, prostate
cancer

EXAM:
PORTABLE CHEST 1 VIEW

[chest ap]
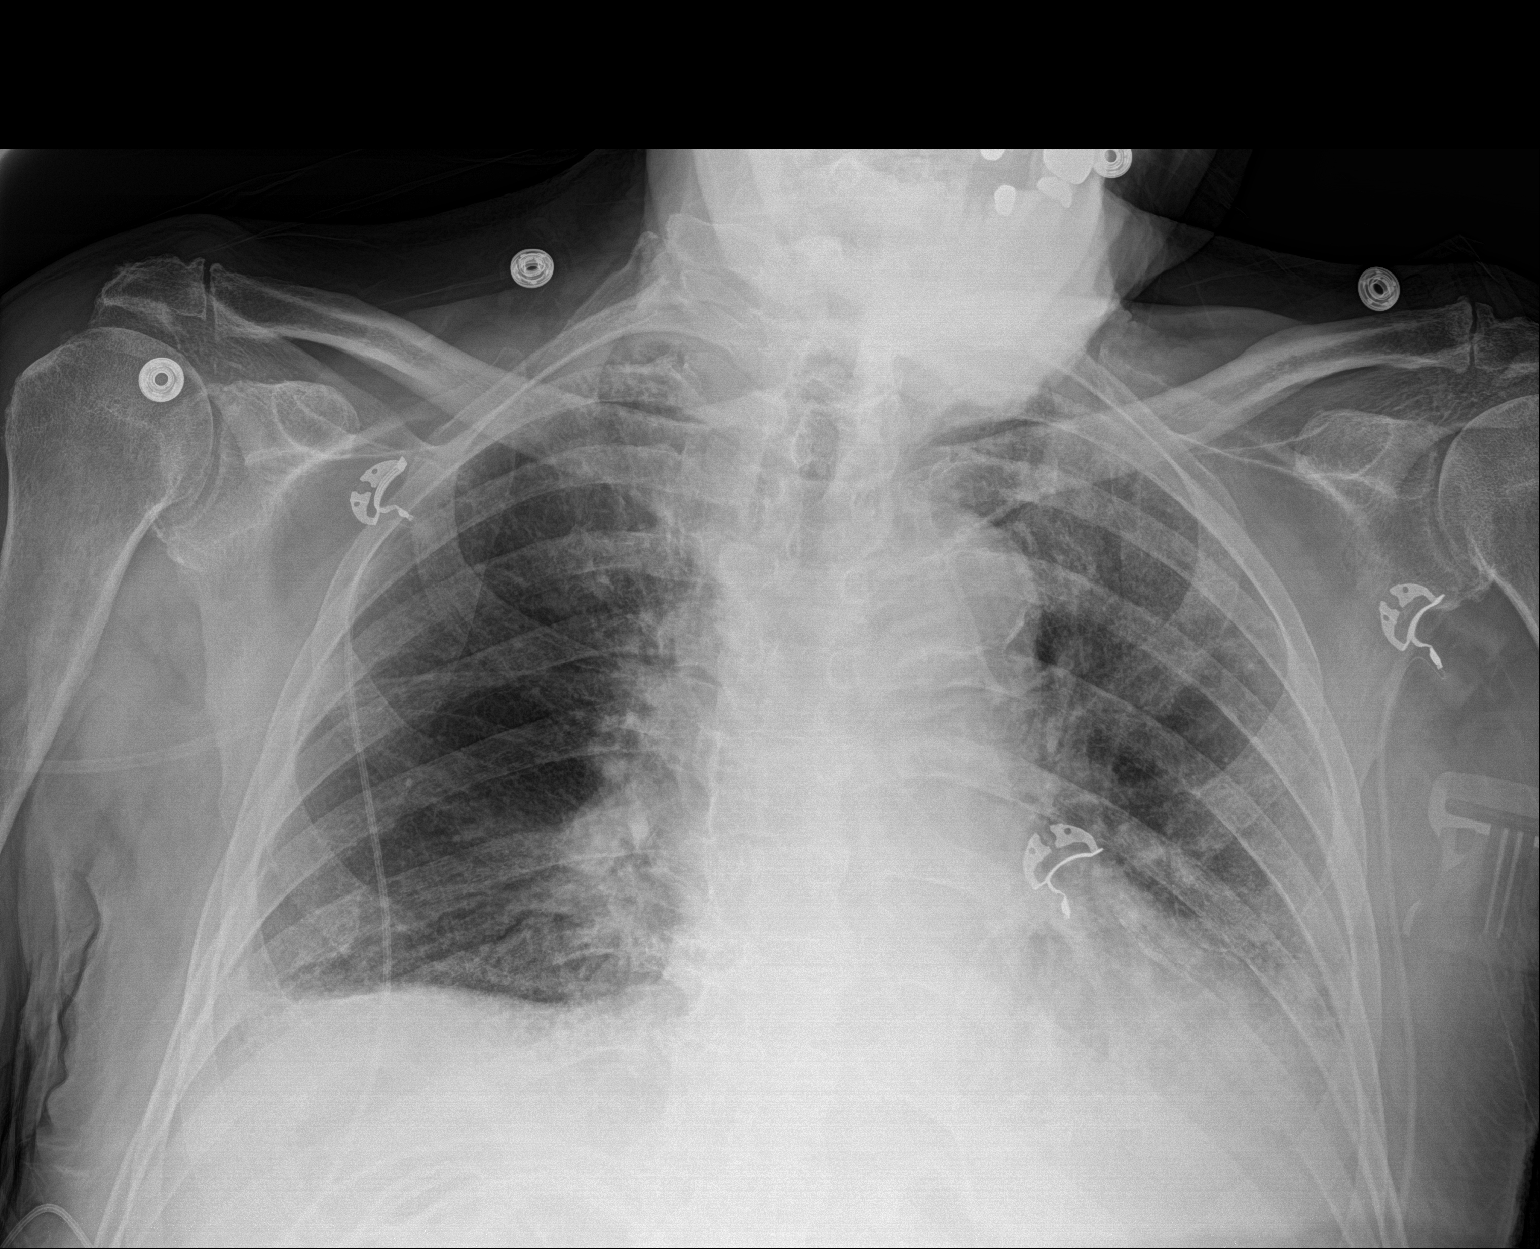

[1 of 1 positions shown; findings below may reference images not displayed]

FINDINGS: Normal heart size, mediastinal contours, and pulmonary vascularity.

Atherosclerotic calcification aorta.

Minimal atelectasis RIGHT base.

LEFT basilar infiltrate with question additional mild infiltrate in
LEFT upper lobe as well.

RIGHT apex scarring.

No pleural effusion or pneumothorax.

Bones demineralized.
IMPRESSION: LEFT basilar infiltrate with question of additional mild infiltrate
LEFT upper lobe.

RIGHT apex scarring and minimal RIGHT basilar atelectasis.
